# Patient Record
Sex: Female | Born: 1970 | Race: White | Hispanic: No | Smoking: Never smoker
Health system: Southern US, Community
[De-identification: ages and names within clinical notes are randomized; demographics above are authoritative.]

## PROBLEM LIST (undated history)

## (undated) DIAGNOSIS — J45909 Unspecified asthma, uncomplicated: Secondary | ICD-10-CM

## (undated) DIAGNOSIS — E119 Type 2 diabetes mellitus without complications: Secondary | ICD-10-CM

## (undated) DIAGNOSIS — G43909 Migraine, unspecified, not intractable, without status migrainosus: Secondary | ICD-10-CM

## (undated) DIAGNOSIS — Z9189 Other specified personal risk factors, not elsewhere classified: Secondary | ICD-10-CM

## (undated) DIAGNOSIS — E785 Hyperlipidemia, unspecified: Secondary | ICD-10-CM

## (undated) DIAGNOSIS — Z993 Dependence on wheelchair: Secondary | ICD-10-CM

## (undated) DIAGNOSIS — C801 Malignant (primary) neoplasm, unspecified: Secondary | ICD-10-CM

## (undated) HISTORY — DX: Hyperlipidemia, unspecified: E78.5

## (undated) HISTORY — PX: ABDOMINAL HYSTERECTOMY: SHX81

## (undated) HISTORY — DX: Other specified personal risk factors, not elsewhere classified: Z91.89

---

## 2002-06-30 ENCOUNTER — Emergency Department (HOSPITAL_COMMUNITY): Admission: EM | Admit: 2002-06-30 | Discharge: 2002-06-30 | Payer: Self-pay | Admitting: *Deleted

## 2004-05-12 ENCOUNTER — Emergency Department: Payer: Self-pay | Admitting: Emergency Medicine

## 2004-10-01 ENCOUNTER — Emergency Department: Payer: Self-pay | Admitting: Emergency Medicine

## 2004-11-30 ENCOUNTER — Emergency Department: Payer: Self-pay | Admitting: Emergency Medicine

## 2005-04-04 ENCOUNTER — Emergency Department: Payer: Self-pay | Admitting: Internal Medicine

## 2005-07-25 ENCOUNTER — Emergency Department: Payer: Self-pay | Admitting: Emergency Medicine

## 2005-12-23 ENCOUNTER — Emergency Department: Payer: Self-pay | Admitting: Emergency Medicine

## 2006-03-13 ENCOUNTER — Emergency Department: Payer: Self-pay | Admitting: Emergency Medicine

## 2006-04-10 ENCOUNTER — Ambulatory Visit: Payer: Self-pay | Admitting: Internal Medicine

## 2006-07-20 ENCOUNTER — Emergency Department: Payer: Self-pay

## 2006-09-14 ENCOUNTER — Emergency Department: Payer: Self-pay | Admitting: General Practice

## 2006-09-16 ENCOUNTER — Emergency Department: Payer: Self-pay | Admitting: Unknown Physician Specialty

## 2006-12-13 ENCOUNTER — Emergency Department: Payer: Self-pay | Admitting: Emergency Medicine

## 2006-12-13 ENCOUNTER — Other Ambulatory Visit: Payer: Self-pay

## 2007-07-04 ENCOUNTER — Emergency Department: Payer: Self-pay | Admitting: Emergency Medicine

## 2008-04-19 ENCOUNTER — Other Ambulatory Visit: Payer: Self-pay

## 2008-04-19 ENCOUNTER — Emergency Department: Payer: Self-pay | Admitting: Emergency Medicine

## 2009-06-28 ENCOUNTER — Emergency Department: Payer: Self-pay | Admitting: Emergency Medicine

## 2009-08-10 ENCOUNTER — Emergency Department: Payer: Self-pay | Admitting: Emergency Medicine

## 2010-01-09 ENCOUNTER — Emergency Department: Payer: Self-pay | Admitting: Emergency Medicine

## 2010-03-02 ENCOUNTER — Emergency Department: Payer: Self-pay | Admitting: Emergency Medicine

## 2010-03-16 ENCOUNTER — Emergency Department: Payer: Self-pay | Admitting: Emergency Medicine

## 2010-09-28 ENCOUNTER — Emergency Department: Payer: Self-pay | Admitting: Emergency Medicine

## 2010-10-06 ENCOUNTER — Emergency Department: Payer: Self-pay | Admitting: Emergency Medicine

## 2010-11-02 ENCOUNTER — Emergency Department: Payer: Self-pay | Admitting: Internal Medicine

## 2011-01-27 ENCOUNTER — Emergency Department: Payer: Self-pay | Admitting: Internal Medicine

## 2011-04-04 ENCOUNTER — Emergency Department: Payer: Self-pay | Admitting: Emergency Medicine

## 2011-08-14 ENCOUNTER — Emergency Department: Payer: Self-pay | Admitting: Emergency Medicine

## 2011-08-22 ENCOUNTER — Emergency Department: Payer: Self-pay | Admitting: Emergency Medicine

## 2012-01-19 ENCOUNTER — Emergency Department: Payer: Self-pay | Admitting: *Deleted

## 2012-02-02 ENCOUNTER — Emergency Department: Payer: Self-pay | Admitting: Emergency Medicine

## 2012-04-17 ENCOUNTER — Emergency Department: Payer: Self-pay | Admitting: Unknown Physician Specialty

## 2012-04-17 LAB — BASIC METABOLIC PANEL
Anion Gap: 8 (ref 7–16)
Calcium, Total: 8.7 mg/dL (ref 8.5–10.1)
Creatinine: 0.75 mg/dL (ref 0.60–1.30)
EGFR (Non-African Amer.): >60
Glucose: 241 mg/dL — ABNORMAL HIGH (ref 65–99)
Osmolality: 285 (ref 275–301)
Potassium: 4.3 mmol/L (ref 3.5–5.1)
Sodium: 138 mmol/L (ref 136–145)

## 2012-04-17 LAB — HEPATIC FUNCTION PANEL A (ARMC)
Albumin: 3 g/dL — ABNORMAL LOW (ref 3.4–5.0)
Alkaline Phosphatase: 80 U/L (ref 50–136)
Bilirubin,Total: 0.2 mg/dL (ref 0.2–1.0)
SGOT(AST): 17 U/L (ref 15–37)
Total Protein: 7.6 g/dL (ref 6.4–8.2)

## 2012-04-17 LAB — CBC
HGB: 11.8 g/dL — ABNORMAL LOW (ref 12.0–16.0)
MCH: 26 pg (ref 26.0–34.0)
MCV: 78 fL — ABNORMAL LOW (ref 80–100)
RBC: 4.56 10*6/uL (ref 3.80–5.20)
WBC: 7.8 10*3/uL (ref 3.6–11.0)

## 2012-04-17 LAB — TROPONIN I: Troponin-I: <0.02 ng/mL

## 2012-04-17 LAB — CK TOTAL AND CKMB (NOT AT ARMC): CK-MB: 1.3 ng/mL (ref 0.5–3.6)

## 2012-04-17 LAB — LIPASE, BLOOD: Lipase: 128 U/L (ref 73–393)

## 2012-10-07 ENCOUNTER — Emergency Department: Payer: Self-pay | Admitting: Emergency Medicine

## 2012-10-07 LAB — CBC WITH DIFFERENTIAL/PLATELET
Basophil %: 0.8 %
Eosinophil #: 0.4 10*3/uL (ref 0.0–0.7)
HCT: 35.9 % (ref 35.0–47.0)
HGB: 11.8 g/dL — ABNORMAL LOW (ref 12.0–16.0)
Lymphocyte #: 2.2 10*3/uL (ref 1.0–3.6)
MCV: 77 fL — ABNORMAL LOW (ref 80–100)
Monocyte %: 6.6 %
Neutrophil #: 5.2 10*3/uL (ref 1.4–6.5)
Neutrophil %: 61.5 %
Platelet: 333 10*3/uL (ref 150–440)
RBC: 4.64 10*6/uL (ref 3.80–5.20)

## 2012-10-07 LAB — BASIC METABOLIC PANEL
Calcium, Total: 8.4 mg/dL — ABNORMAL LOW (ref 8.5–10.1)
Co2: 27 mmol/L (ref 21–32)
Creatinine: 0.9 mg/dL (ref 0.60–1.30)
Potassium: 4.1 mmol/L (ref 3.5–5.1)
Sodium: 138 mmol/L (ref 136–145)

## 2012-10-13 LAB — CULTURE, BLOOD (SINGLE)

## 2012-11-06 LAB — LIPID PANEL
Cholesterol: 180 mg/dL (ref 0–200)
HDL: 60 mg/dL (ref 35–70)
LDL CALC: 97 mg/dL
TRIGLYCERIDES: 117 mg/dL (ref 40–160)

## 2012-11-13 ENCOUNTER — Other Ambulatory Visit: Payer: Self-pay | Admitting: Ophthalmology

## 2012-11-13 LAB — HM DIABETES EYE EXAM

## 2013-02-23 ENCOUNTER — Emergency Department: Payer: Self-pay | Admitting: Emergency Medicine

## 2013-02-23 LAB — CBC WITH DIFFERENTIAL/PLATELET
Basophil #: 0 10*3/uL (ref 0.0–0.1)
Basophil %: 0.3 %
Lymphocyte #: 1.8 10*3/uL (ref 1.0–3.6)
MCHC: 34.1 g/dL (ref 32.0–36.0)
MCV: 76 fL — ABNORMAL LOW (ref 80–100)
Monocyte #: 0.9 x10 3/mm (ref 0.2–0.9)
Monocyte %: 6.3 %
Neutrophil #: 11.2 10*3/uL — ABNORMAL HIGH (ref 1.4–6.5)
Platelet: 374 10*3/uL (ref 150–440)
RBC: 4.49 10*6/uL (ref 3.80–5.20)
WBC: 14 10*3/uL — ABNORMAL HIGH (ref 3.6–11.0)

## 2013-02-23 LAB — COMPREHENSIVE METABOLIC PANEL
Alkaline Phosphatase: 110 U/L (ref 50–136)
Bilirubin,Total: 0.4 mg/dL (ref 0.2–1.0)
Calcium, Total: 8.8 mg/dL (ref 8.5–10.1)
Chloride: 102 mmol/L (ref 98–107)
Co2: 23 mmol/L (ref 21–32)
Creatinine: 0.87 mg/dL (ref 0.60–1.30)
EGFR (Non-African Amer.): >60
Potassium: 3.3 mmol/L — ABNORMAL LOW (ref 3.5–5.1)
SGOT(AST): 21 U/L (ref 15–37)
Sodium: 133 mmol/L — ABNORMAL LOW (ref 136–145)
Total Protein: 8.2 g/dL (ref 6.4–8.2)

## 2013-06-10 ENCOUNTER — Emergency Department: Payer: Self-pay | Admitting: Emergency Medicine

## 2013-07-20 ENCOUNTER — Emergency Department: Payer: Self-pay | Admitting: Emergency Medicine

## 2013-07-20 LAB — CBC
HCT: 37.7 % (ref 35.0–47.0)
HGB: 12.4 g/dL (ref 12.0–16.0)
MCH: 25.6 pg — ABNORMAL LOW (ref 26.0–34.0)
MCHC: 33 g/dL (ref 32.0–36.0)
Platelet: 322 10*3/uL (ref 150–440)
RBC: 4.86 10*6/uL (ref 3.80–5.20)
WBC: 9.6 10*3/uL (ref 3.6–11.0)

## 2013-07-20 LAB — URINALYSIS, COMPLETE
Bilirubin,UR: NEGATIVE
Leukocyte Esterase: NEGATIVE
Nitrite: NEGATIVE
Protein: 30
RBC,UR: 3 /HPF (ref 0–5)
Squamous Epithelial: 4
WBC UR: 2 /HPF (ref 0–5)

## 2013-07-20 LAB — TROPONIN I
Troponin-I: <0.02 ng/mL
Troponin-I: <0.02 ng/mL

## 2013-07-20 LAB — ETHANOL
Ethanol %: <0.003 % (ref 0.000–0.080)
Ethanol: <3 mg/dL

## 2013-07-20 LAB — COMPREHENSIVE METABOLIC PANEL
Albumin: 3.1 g/dL — ABNORMAL LOW (ref 3.4–5.0)
Alkaline Phosphatase: 87 U/L
BUN: 12 mg/dL (ref 7–18)
Chloride: 104 mmol/L (ref 98–107)
Creatinine: 0.73 mg/dL (ref 0.60–1.30)
EGFR (Non-African Amer.): >60
Glucose: 293 mg/dL — ABNORMAL HIGH (ref 65–99)
Potassium: 4.2 mmol/L (ref 3.5–5.1)
SGOT(AST): 24 U/L (ref 15–37)
Sodium: 132 mmol/L — ABNORMAL LOW (ref 136–145)

## 2013-07-20 LAB — DRUG SCREEN, URINE
Amphetamines, Ur Screen: NEGATIVE (ref ?–1000)
Benzodiazepine, Ur Scrn: NEGATIVE (ref ?–200)
Cannabinoid 50 Ng, Ur ~~LOC~~: NEGATIVE (ref ?–50)
Cocaine Metabolite,Ur ~~LOC~~: NEGATIVE (ref ?–300)
Methadone, Ur Screen: NEGATIVE (ref ?–300)
Opiate, Ur Screen: NEGATIVE (ref ?–300)
Phencyclidine (PCP) Ur S: NEGATIVE (ref ?–25)

## 2013-07-23 DIAGNOSIS — Z9189 Other specified personal risk factors, not elsewhere classified: Secondary | ICD-10-CM

## 2013-07-23 HISTORY — DX: Other specified personal risk factors, not elsewhere classified: Z91.89

## 2014-02-04 ENCOUNTER — Emergency Department: Payer: Self-pay | Admitting: Emergency Medicine

## 2014-02-06 ENCOUNTER — Inpatient Hospital Stay: Payer: Self-pay | Admitting: Internal Medicine

## 2014-02-06 LAB — URINALYSIS, COMPLETE
BACTERIA: NONE SEEN
BLOOD: NEGATIVE
Bilirubin,UR: NEGATIVE
Glucose,UR: >=500 mg/dL (ref 0–75)
KETONE: NEGATIVE
Leukocyte Esterase: NEGATIVE
NITRITE: NEGATIVE
Ph: 5 (ref 4.5–8.0)
RBC,UR: 7 /HPF (ref 0–5)
Specific Gravity: 1.01 (ref 1.003–1.030)
Squamous Epithelial: 2
WBC UR: 6 /HPF (ref 0–5)

## 2014-02-06 LAB — CBC WITH DIFFERENTIAL/PLATELET
BASOS ABS: 0.1 10*3/uL (ref 0.0–0.1)
BASOS PCT: 0.4 %
EOS ABS: 0 10*3/uL (ref 0.0–0.7)
EOS PCT: 0.4 %
HCT: 35.6 % (ref 35.0–47.0)
HGB: 11.6 g/dL — AB (ref 12.0–16.0)
LYMPHS ABS: 1 10*3/uL (ref 1.0–3.6)
Lymphocyte %: 7.9 %
MCH: 25.8 pg — ABNORMAL LOW (ref 26.0–34.0)
MCHC: 32.7 g/dL (ref 32.0–36.0)
MCV: 79 fL — AB (ref 80–100)
MONOS PCT: 7.6 %
Monocyte #: 1 10*3/uL — ABNORMAL HIGH (ref 0.2–0.9)
NEUTROS ABS: 11 10*3/uL — AB (ref 1.4–6.5)
NEUTROS PCT: 83.7 %
PLATELETS: 330 10*3/uL (ref 150–440)
RBC: 4.51 10*6/uL (ref 3.80–5.20)
RDW: 14.3 % (ref 11.5–14.5)
WBC: 13.2 10*3/uL — ABNORMAL HIGH (ref 3.6–11.0)

## 2014-02-06 LAB — COMPREHENSIVE METABOLIC PANEL
Albumin: 2.6 g/dL — ABNORMAL LOW (ref 3.4–5.0)
Alkaline Phosphatase: 97 U/L
Anion Gap: 9 (ref 7–16)
BILIRUBIN TOTAL: 0.4 mg/dL (ref 0.2–1.0)
BUN: 8 mg/dL (ref 7–18)
CO2: 24 mmol/L (ref 21–32)
Calcium, Total: 8.1 mg/dL — ABNORMAL LOW (ref 8.5–10.1)
Chloride: 99 mmol/L (ref 98–107)
Creatinine: 0.95 mg/dL (ref 0.60–1.30)
EGFR (African American): >60
EGFR (Non-African Amer.): >60
Glucose: 353 mg/dL — ABNORMAL HIGH (ref 65–99)
Osmolality: 277 (ref 275–301)
Potassium: 3.8 mmol/L (ref 3.5–5.1)
SGOT(AST): 12 U/L — ABNORMAL LOW (ref 15–37)
SGPT (ALT): 12 U/L (ref 12–78)
Sodium: 132 mmol/L — ABNORMAL LOW (ref 136–145)
TOTAL PROTEIN: 7.6 g/dL (ref 6.4–8.2)

## 2014-02-07 LAB — CBC WITH DIFFERENTIAL/PLATELET
BASOS ABS: 0.1 10*3/uL (ref 0.0–0.1)
Basophil %: 0.4 %
EOS ABS: 0.1 10*3/uL (ref 0.0–0.7)
EOS PCT: 0.5 %
HCT: 34.1 % — AB (ref 35.0–47.0)
HGB: 10.8 g/dL — ABNORMAL LOW (ref 12.0–16.0)
LYMPHS ABS: 1.3 10*3/uL (ref 1.0–3.6)
Lymphocyte %: 10.7 %
MCH: 25.1 pg — ABNORMAL LOW (ref 26.0–34.0)
MCHC: 31.7 g/dL — ABNORMAL LOW (ref 32.0–36.0)
MCV: 79 fL — AB (ref 80–100)
Monocyte #: 1.1 x10 3/mm — ABNORMAL HIGH (ref 0.2–0.9)
Monocyte %: 8.7 %
NEUTROS PCT: 79.7 %
Neutrophil #: 10 10*3/uL — ABNORMAL HIGH (ref 1.4–6.5)
Platelet: 309 10*3/uL (ref 150–440)
RBC: 4.3 10*6/uL (ref 3.80–5.20)
RDW: 14.1 % (ref 11.5–14.5)
WBC: 12.6 10*3/uL — ABNORMAL HIGH (ref 3.6–11.0)

## 2014-02-07 LAB — BASIC METABOLIC PANEL
Anion Gap: 6 — ABNORMAL LOW (ref 7–16)
BUN: 6 mg/dL — ABNORMAL LOW (ref 7–18)
CALCIUM: 7.8 mg/dL — AB (ref 8.5–10.1)
Chloride: 100 mmol/L (ref 98–107)
Co2: 26 mmol/L (ref 21–32)
Creatinine: 0.88 mg/dL (ref 0.60–1.30)
GLUCOSE: 265 mg/dL — AB (ref 65–99)
Osmolality: 271 (ref 275–301)
Potassium: 3.9 mmol/L (ref 3.5–5.1)
SODIUM: 132 mmol/L — AB (ref 136–145)

## 2014-02-07 LAB — MAGNESIUM: MAGNESIUM: 1.8 mg/dL

## 2014-02-08 LAB — BASIC METABOLIC PANEL
Anion Gap: 6 — ABNORMAL LOW (ref 7–16)
BUN: 5 mg/dL — ABNORMAL LOW (ref 7–18)
CALCIUM: 7.8 mg/dL — AB (ref 8.5–10.1)
CHLORIDE: 100 mmol/L (ref 98–107)
CREATININE: 0.78 mg/dL (ref 0.60–1.30)
Co2: 27 mmol/L (ref 21–32)
GLUCOSE: 220 mg/dL — AB (ref 65–99)
Osmolality: 270 (ref 275–301)
POTASSIUM: 3.7 mmol/L (ref 3.5–5.1)
Sodium: 133 mmol/L — ABNORMAL LOW (ref 136–145)

## 2014-02-08 LAB — CBC WITH DIFFERENTIAL/PLATELET
BASOS ABS: 0 10*3/uL (ref 0.0–0.1)
BASOS PCT: 0.4 %
EOS ABS: 0.1 10*3/uL (ref 0.0–0.7)
Eosinophil %: 0.6 %
HCT: 32.4 % — ABNORMAL LOW (ref 35.0–47.0)
HGB: 10.4 g/dL — ABNORMAL LOW (ref 12.0–16.0)
LYMPHS ABS: 1.6 10*3/uL (ref 1.0–3.6)
Lymphocyte %: 14.3 %
MCH: 25.3 pg — ABNORMAL LOW (ref 26.0–34.0)
MCHC: 32.2 g/dL (ref 32.0–36.0)
MCV: 78 fL — ABNORMAL LOW (ref 80–100)
MONO ABS: 0.8 x10 3/mm (ref 0.2–0.9)
Monocyte %: 7.4 %
NEUTROS PCT: 77.3 %
Neutrophil #: 8.5 10*3/uL — ABNORMAL HIGH (ref 1.4–6.5)
PLATELETS: 331 10*3/uL (ref 150–440)
RBC: 4.13 10*6/uL (ref 3.80–5.20)
RDW: 14.1 % (ref 11.5–14.5)
WBC: 11 10*3/uL (ref 3.6–11.0)

## 2014-02-08 LAB — HEMOGLOBIN A1C: HEMOGLOBIN A1C: 11.3 % — AB (ref 4.2–6.3)

## 2014-02-08 LAB — VANCOMYCIN, TROUGH: Vancomycin, Trough: 6 ug/mL — ABNORMAL LOW (ref 10–20)

## 2014-02-09 LAB — CBC WITH DIFFERENTIAL/PLATELET
BASOS PCT: 0.3 %
Basophil #: 0 10*3/uL (ref 0.0–0.1)
EOS ABS: 0.2 10*3/uL (ref 0.0–0.7)
Eosinophil %: 2.4 %
HCT: 31.8 % — ABNORMAL LOW (ref 35.0–47.0)
HGB: 10.6 g/dL — ABNORMAL LOW (ref 12.0–16.0)
LYMPHS ABS: 1.5 10*3/uL (ref 1.0–3.6)
Lymphocyte %: 19.3 %
MCH: 26.1 pg (ref 26.0–34.0)
MCHC: 33.5 g/dL (ref 32.0–36.0)
MCV: 78 fL — AB (ref 80–100)
MONOS PCT: 6.8 %
Monocyte #: 0.5 x10 3/mm (ref 0.2–0.9)
NEUTROS PCT: 71.2 %
Neutrophil #: 5.6 10*3/uL (ref 1.4–6.5)
PLATELETS: 341 10*3/uL (ref 150–440)
RBC: 4.07 10*6/uL (ref 3.80–5.20)
RDW: 14.5 % (ref 11.5–14.5)
WBC: 7.9 10*3/uL (ref 3.6–11.0)

## 2014-02-09 LAB — BASIC METABOLIC PANEL
Anion Gap: 5 — ABNORMAL LOW (ref 7–16)
BUN: 6 mg/dL — AB (ref 7–18)
CHLORIDE: 102 mmol/L (ref 98–107)
CO2: 29 mmol/L (ref 21–32)
CREATININE: 0.8 mg/dL (ref 0.60–1.30)
Calcium, Total: 8.1 mg/dL — ABNORMAL LOW (ref 8.5–10.1)
EGFR (African American): >60
Glucose: 247 mg/dL — ABNORMAL HIGH (ref 65–99)
OSMOLALITY: 278 (ref 275–301)
POTASSIUM: 3.5 mmol/L (ref 3.5–5.1)
Sodium: 136 mmol/L (ref 136–145)

## 2014-02-09 LAB — HCG, QUANTITATIVE, PREGNANCY

## 2014-02-10 LAB — BASIC METABOLIC PANEL
Anion Gap: 8 (ref 7–16)
BUN: 5 mg/dL — ABNORMAL LOW (ref 7–18)
CALCIUM: 8.3 mg/dL — AB (ref 8.5–10.1)
CO2: 27 mmol/L (ref 21–32)
Chloride: 103 mmol/L (ref 98–107)
Creatinine: 0.8 mg/dL (ref 0.60–1.30)
EGFR (African American): >60
Glucose: 220 mg/dL — ABNORMAL HIGH (ref 65–99)
Osmolality: 280 (ref 275–301)
Potassium: 3.8 mmol/L (ref 3.5–5.1)
SODIUM: 138 mmol/L (ref 136–145)

## 2014-02-10 LAB — CBC WITH DIFFERENTIAL/PLATELET
BASOS ABS: 0 10*3/uL (ref 0.0–0.1)
Basophil %: 0.5 %
EOS ABS: 0.3 10*3/uL (ref 0.0–0.7)
EOS PCT: 4 %
HCT: 33.3 % — ABNORMAL LOW (ref 35.0–47.0)
HGB: 11 g/dL — AB (ref 12.0–16.0)
Lymphocyte #: 1.4 10*3/uL (ref 1.0–3.6)
Lymphocyte %: 17.8 %
MCH: 25.6 pg — ABNORMAL LOW (ref 26.0–34.0)
MCHC: 33.1 g/dL (ref 32.0–36.0)
MCV: 78 fL — AB (ref 80–100)
MONO ABS: 0.6 x10 3/mm (ref 0.2–0.9)
MONOS PCT: 7.4 %
Neutrophil #: 5.6 10*3/uL (ref 1.4–6.5)
Neutrophil %: 70.3 %
PLATELETS: 414 10*3/uL (ref 150–440)
RBC: 4.29 10*6/uL (ref 3.80–5.20)
RDW: 14.1 % (ref 11.5–14.5)
WBC: 8 10*3/uL (ref 3.6–11.0)

## 2014-02-10 LAB — RAPID HIV SCREEN (HIV 1/2 AB+AG)

## 2014-02-10 LAB — VANCOMYCIN, TROUGH: Vancomycin, Trough: 8 ug/mL — ABNORMAL LOW (ref 10–20)

## 2014-02-11 LAB — WOUND AEROBIC CULTURE

## 2014-02-11 LAB — CULTURE, BLOOD (SINGLE)

## 2014-02-13 LAB — WOUND CULTURE

## 2014-05-20 ENCOUNTER — Emergency Department: Payer: Self-pay | Admitting: Emergency Medicine

## 2014-05-24 ENCOUNTER — Emergency Department: Payer: Self-pay | Admitting: Emergency Medicine

## 2014-05-24 LAB — CBC
HCT: 35.2 % (ref 35.0–47.0)
HGB: 11.6 g/dL — AB (ref 12.0–16.0)
MCH: 26.2 pg (ref 26.0–34.0)
MCHC: 33 g/dL (ref 32.0–36.0)
MCV: 79 fL — ABNORMAL LOW (ref 80–100)
Platelet: 383 10*3/uL (ref 150–440)
RBC: 4.44 10*6/uL (ref 3.80–5.20)
RDW: 14.4 % (ref 11.5–14.5)
WBC: 7.5 10*3/uL (ref 3.6–11.0)

## 2014-05-24 LAB — COMPREHENSIVE METABOLIC PANEL
ALBUMIN: 2.9 g/dL — AB (ref 3.4–5.0)
AST: 14 U/L — AB (ref 15–37)
Alkaline Phosphatase: 83 U/L
Anion Gap: 7 (ref 7–16)
BUN: 9 mg/dL (ref 7–18)
Bilirubin,Total: 0.2 mg/dL (ref 0.2–1.0)
CO2: 25 mmol/L (ref 21–32)
CREATININE: 1.07 mg/dL (ref 0.60–1.30)
Calcium, Total: 8.1 mg/dL — ABNORMAL LOW (ref 8.5–10.1)
Chloride: 107 mmol/L (ref 98–107)
EGFR (African American): >60
GFR CALC NON AF AMER: 59 — AB
GLUCOSE: 231 mg/dL — AB (ref 65–99)
OSMOLALITY: 284 (ref 275–301)
Potassium: 4 mmol/L (ref 3.5–5.1)
SGPT (ALT): 15 U/L
SODIUM: 139 mmol/L (ref 136–145)
Total Protein: 7.8 g/dL (ref 6.4–8.2)

## 2014-05-24 LAB — CK TOTAL AND CKMB (NOT AT ARMC)
CK, TOTAL: 86 U/L
CK-MB: 1.3 ng/mL (ref 0.5–3.6)

## 2014-05-24 LAB — TROPONIN I

## 2014-07-28 ENCOUNTER — Encounter (INDEPENDENT_AMBULATORY_CARE_PROVIDER_SITE_OTHER): Payer: Self-pay

## 2014-07-28 ENCOUNTER — Ambulatory Visit (INDEPENDENT_AMBULATORY_CARE_PROVIDER_SITE_OTHER): Payer: Self-pay | Admitting: Cardiovascular Disease

## 2014-07-28 ENCOUNTER — Emergency Department: Payer: Self-pay | Admitting: Emergency Medicine

## 2014-07-28 VITALS — BP 145/84 | HR 100 | Ht 65.0 in

## 2014-07-28 DIAGNOSIS — R0789 Other chest pain: Secondary | ICD-10-CM

## 2014-07-28 DIAGNOSIS — R079 Chest pain, unspecified: Secondary | ICD-10-CM

## 2014-07-28 LAB — BASIC METABOLIC PANEL
ANION GAP: 7 (ref 7–16)
BUN: 14 mg/dL (ref 7–18)
CALCIUM: 8.4 mg/dL — AB (ref 8.5–10.1)
CHLORIDE: 105 mmol/L (ref 98–107)
Co2: 25 mmol/L (ref 21–32)
Creatinine: 1.05 mg/dL (ref 0.60–1.30)
EGFR (African American): >60
EGFR (Non-African Amer.): >60
GLUCOSE: 286 mg/dL — AB (ref 65–99)
OSMOLALITY: 285 (ref 275–301)
Potassium: 4.1 mmol/L (ref 3.5–5.1)
Sodium: 137 mmol/L (ref 136–145)

## 2014-07-28 LAB — CBC
HCT: 36.6 % (ref 35.0–47.0)
HGB: 12 g/dL (ref 12.0–16.0)
MCH: 26 pg (ref 26.0–34.0)
MCHC: 32.9 g/dL (ref 32.0–36.0)
MCV: 79 fL — AB (ref 80–100)
Platelet: 301 10*3/uL (ref 150–440)
RBC: 4.63 10*6/uL (ref 3.80–5.20)
RDW: 14.7 % — AB (ref 11.5–14.5)
WBC: 7.8 10*3/uL (ref 3.6–11.0)

## 2014-07-28 LAB — TROPONIN I
Troponin-I: <0.02 ng/mL
Troponin-I: <0.02 ng/mL

## 2014-07-28 NOTE — Procedures (Signed)
Exercise Treadmill Test  Treadmill ordered for recent epsiodes of chest pain.  Resting EKG shows NSR with rate of 80 bpm, nonspecific St changes, rare APCs. Resting blood pressure of 122/62. Stand bruce protocal was used.  Patient exercised for 6 min 49 sec,  Peak heart rate of 150 bpm.  This was 84% of the maximum predicted heart rate (177 bpm). Achieved 8.2 METS No symptoms of chest pain or lightheadedness were reported at peak stress or in recovery.  Peak Blood pressure recorded was 159/89 Heart rate at 3 minutes in recovery was 120 bpm. No significant ST or T-wave changes at peak stress or in recovery  FINAL IMPRESSION: Normal exercise stress test. No significant EKG changes concerning for ischemia. Good exercise tolerance.

## 2014-07-28 NOTE — Patient Instructions (Signed)
Your stress test was normal.   Call or return to clinic prn if these symptoms worsen or fail to improve as anticipated.

## 2014-09-20 ENCOUNTER — Emergency Department: Payer: Self-pay | Admitting: Emergency Medicine

## 2014-11-13 NOTE — Consult Note (Signed)
PATIENT NAME:  Wendy Frazier, Wendy Frazier MR#:  [id number] DATE OF BIRTH:  Oct 16, 1970  DATE OF CONSULTATION:  02/08/2014  CONSULTING PHYSICIAN:  Cheral Marker. Ola Spurr, MD  REQUESTING PHYSICIAN: Dr. Manuella Ghazi.   REASON FOR CONSULTATION: Sepsis and cellulitis.   HISTORY OF PRESENT ILLNESS: This is a 44 year old female with a history of very poorly controlled diabetes on no medication who was admitted 07/18 after 5 days of worsening back pain from a lumbar abscess. Two days prior to admission she had apparently had drainage with I and D of an abscess but no cultures were sent. When she came back for a wound check, she was found to have fevers and chills. She was admitted for further treatment. Since being admitted. She has been treated with vancomycin and Zosyn. She has had some recurrent fevers as well but reports she is improving somewhat. She has been seen by surgery, who did not feel there was any need for further abscess debridement. She has had an MRI and CT scan, both of which were negative for drainable focal fluid collection.   The patient reports she has had prior abscesses and boils before. She is not sure if any of them were ever MRSA. She has not needed admission for any of these before. Her husband, who she lives with, also has had recurrent boils.   PAST MEDICAL HISTORY:  1. Diabetes, poorly controlled on no medications.  2. Hyperlipidemia.   PAST SURGICAL HISTORY: None.   SOCIAL HISTORY: Denies tobacco, alcohol, or drugs. She lives with her husband.   FAMILY HISTORY: Positive for diabetes.   ALLERGIES: SHE IS ALLERGIC TO MORPHINE.   ANTIBIOTICS SINCE ADMISSION: Include vancomycin and Zosyn.   REVIEW OF SYSTEMS: Eleven systems were reviewed and negative except as per HPI.   PHYSICAL EXAMINATION:  VITAL SIGNS: Temperature on admission was 102.8. On the 19th she had a temperature of 102.4. Currently she is 98.7, blood pressure is 116/74, respirations 83, sat 96% on room air.  GENERAL: She is  obese. She is lying in bed in some pain.  HEENT: Pupils equal, round, and reactive to light and accommodation. Extraocular movements are intact. Sclerae are anicteric.  OROPHARYNX: Clear.  NECK: Supple.  HEART: Regular.  LUNGS: Clear.  ABDOMEN: Soft, nontender, nondistended.  EXTREMITIES: No clubbing, cyanosis, or edema.  SKIN: She has on her back a large area of induration approximately softball size. In the center approximately a golf ball size, there is a round area of indurationand  wound with heaped up  edged. There is purulence at this site. It is quite tender.  NEUROLOGIC: She is alert and oriented x 3. Grossly nonfocal neuro exam.   LABORATORY DATA: White blood count on admission was 13.2, currently it is 11.0. Hemoglobin 10.4, platelets 331,000. Blood cultures x 2 are negative. Urinalysis on admission was negative. Renal function shows a creatinine 0.78. A1c is 11.3. LFTs are normal, except low albumin at 2.6.   Imaging: CT of her lumbar spine with contrast showed severe edema in the subcutaneous fat of the back extending from T11 to S1 with overlying skin thickening concerning the cellulitis. There is no drainable fluid collection. MRI of the lumbar spine showed cellulitis of the posterior fat. No evidence of focal drainable collection. No evidence of spinal involvement.   IMPRESSION:  1. A 44 year old with very poorly controlled diabetes admitted with cellulitis and abscess of her back over her lumbar spine. She had drainage apparently in the ER, but no cultures were sent. Blood cultures  are negative. Her white blood count was 13 on admission and is now down to 11. Her fever curve is decreasing. She has been responding to vancomycin and Zosyn.  2. Cultures were sent today. I would await these culture and sensitivity results before deciding on final antibiotics. This is most likely methicillin-resistant Staphylococcus aureus but it could also be superinfected.  3. I have asked the nurse  to do wet-to-dry dressing changes twice a day to see if we can remove some of the purulent exudate.  4. There does not appear to be anything focal to drain at this point.  5. If she improves over the next 1-2 days and cultures become available she could be discharged on oral antibiotics as there is no bony involvement.   Thank you for the consult. I will be glad to follow with you.     ____________________________ Cheral Marker. Ola Spurr, MD dpf:lt D: 02/08/2014 21:42:09 ET T: 02/09/2014 00:46:10 ET JOB#: [id number]  cc: Cheral Marker. Ola Spurr, MD, <Dictator> Lundynn Cohoon Ola Spurr MD ELECTRONICALLY SIGNED 02/10/2014 23:54

## 2014-11-13 NOTE — Op Note (Signed)
PATIENT NAME:  Wendy Frazier, ENNEKING MR#:  [id number] DATE OF BIRTH:  Dec 14, 1970  DATE OF PROCEDURE:  02/09/2014  ATTENDING PHYSICIAN: Chauncey Reading, MD.  PREOPERATIVE DIAGNOSIS: Back abscess.   POSTOPERATIVE DIAGNOSIS: Back abscess.   PROCEDURE PERFORMED: Incision and drainage of back abscess and insertion of half-inch  Penrose drain.   ANESTHESIA: General. LMA.   COMPLICATIONS: None.   INDICATION FOR SURGERY: [name] is a pleasant 44 year old female who presented with a large back abscess who was in need of operative debridement. She was brought to the operating room for this.   DETAILS OF PROCEDURE: Informed consent was obtained.  [name] was brought to operating room. She was induced and endotracheal tube was placed.  General anesthesia was administered. Her back was prepped and draped in a standard surgical fashion. She was placed in the right lateral decubitus position. The incision was made over the area of induration where there was some drainage. This was deepened down into a large cavity which was opened up. There was induration and the cavity had tracked to her left side lateraly   Hemostat was placed in the cavity and all loculations were broken up.  The cavity was irrigated and a  counterincision was made at the lateral most aspect of the cavity.  An inch drain was placed through this cavity and it was sutured in place in 2 places with a 3-0 nylon suture. The sterile dressing was then placed over the wound and the patient was then awoken, extubated and brought to the postanesthesia care unit. There were no immediate complications. Needle, sponge, and instrument counts were correct at the end of the procedure.    ____________________________ Glena Norfolk. Skylar Priest, MD cal:ds D: 02/11/2014 19:22:00 ET T: 02/11/2014 19:53:23 ET JOB#: [id number]  cc: Harrell Gave A. Argentina Kosch, MD, <Dictator> Floyde Parkins MD ELECTRONICALLY SIGNED 02/18/2014 14:53

## 2014-11-13 NOTE — Discharge Summary (Signed)
PATIENT NAME:  Wendy Frazier, Wendy Frazier MR#:  [id number] DATE OF BIRTH:  [birth year]  DATE OF ADMISSION:  02/06/2014 DATE OF DISCHARGE:  02/11/2014  PRIMARY CARE PHYSICIAN: Nonlocal.   CONSULTATIONS: Dr. Ola Spurr, Dr. Pat Patrick.  PROCEDURE:  Incision and debridement.   DISCHARGE DIAGNOSES: 1.  Sepsis due to lumbar area cellulitis.  2.  Diabetes.  3.  Hyponatremia.   CONDITION: Stable.   CODE STATUS: Full code.   HOME MEDICATIONS:  1.  Metformin 1000 mg p.o. b.i.d. with meals. 2.  Actos 30 mg p.o. once a day. 3.  Keflex 500 mg t.i.d. for 10 days.  4.  Oxycodone 5 mg p.o. every 8 hours p.r.n.   DIET: Low-fat, low-cholesterol, ADA diet.   ACTIVITY: As tolerated.   FOLLOWUP CARE:  Follow up PCP Dr. Ola Spurr and Dr. Rexene Edison  within 1-2 weeks.   REASON FOR ADMISSION: Back pain and a fever.   HOSPITAL COURSE: The patient is a 44 year old Caucasian female with a history of diabetes on no medications, presented with back pain and fever about 5 days. The patient was noticed to have lumbar abscess underwent I and D.  For detailed history and physical examination, please refer to his admission note, dictated by Dr. Lavetta Nielsen.   LABORATORY DATA:  On admission date, the patient's sodium 132, potassium 3.8, chloride 99, bicarbonate 24, BUN 8, creatinine 0.95, glucose 353. WBC 17.2, hemoglobin 11.6. MRI of lumbar spine showed consistent with cellulitis.   ASSESSMENT: 1.  Sepsis with lumbar area of cellulitis. The patient was treated with Zosyn and vancomycin.  Dr. Ola Spurr evaluated the patient and suggest change to Ancef IV q. 8 hours according to would culture sensitivity. Dr. Ola Spurr suggested to continue Keflex 500 mg t.i.d. for 10 days after discharge.  2.  Diabetes 2.  Blood sugar is not very well controlled. The patient has been treated with metformin and Actos. We will continue with medication after discharge.  3.  Hyponatremia stable. Appears to be chronic.  4.  The patient has no complaints.  Vital signs are stable. She is clinically stable and will be discharged to home today. I discussed the patient's discharge plan with the patients nurse, case manager, Dr. Ola Spurr.   TIME SPENT: About 37 minutes.   ____________________________ Demetrios Loll, MD qc:ds D: 02/11/2014 14:06:31 ET T: 02/11/2014 15:18:04 ET JOB#: [id number]  cc: Demetrios Loll, MD, <Dictator> Demetrios Loll MD ELECTRONICALLY SIGNED 02/12/2014 17:24

## 2014-11-13 NOTE — H&P (Signed)
PATIENT NAME:  Wendy Frazier, Wendy Frazier MR#:  [id number] DATE OF BIRTH:  1970-10-25  DATE OF ADMISSION:  02/06/2014  REFERRING PHYSICIAN: Delene Loll, PA  PRIMARY CARE PHYSICIAN: Nonlocal.   CHIEF COMPLAINT: Back pain, fever.  HISTORY OF PRESENT ILLNESS: A 43 year old Caucasian female with history of diabetes on no medications presenting with back pain and fever. She has had back pain which originally started about 5 days ago, gradually worsened. Presented to the Emergency Department 2 days ago and noted to have a lumbar abscess, underwent incision and drainage. Since that time has been complaining of fever, documented temperature at home 103 degrees Fahrenheit with associated chills, generalized fatigue and weakness. As far as her back pain, still present 6/10 in intensity described only as "pain." Worse when lying down and movement. No relieving factors. No radiation. She had denoted some drainage on her bandage but describes it as nonpurulent.   REVIEW OF SYSTEMS:  CONSTITUTIONAL: Positive for fever, chills, fatigue, weakness as described above.  EYES: Denies blurry, double vision, or eye pain.  HEENT: Denies tinnitus, ear pain, hearing loss.  RESPIRATORY: Denies cough, wheeze, shortness of breath. CARDIOVASCULAR: Denies chest pain, palpitations, edema.  GASTROINTESTINAL: Denies nausea, vomiting, diarrhea, abdominal pain.  GENITOURINARY: Denies dysuria or hematuria.  ENDOCRINE: Denies nocturia or thyroid problems.  HEMATOLOGIC AND LYMPHATIC: Denies easy bruising, bleeding. SKIN: Denies rashes or lesions to her knowledge.  MUSCULOSKELETAL: Positive for back pain as described above. Denies any neck, shoulder, knee, hip pain, any further arthritic symptoms.  NEUROLOGIC: Denies any paralysis or paresthesias.  PSYCHIATRIC: Denies anxiety or depressive symptoms.   Otherwise, full review of systems performed by me is negative.   PAST MEDICAL HISTORY: Diabetes, hyperlipidemia.   SOCIAL HISTORY: Denies  any alcohol, tobacco, or drug usage.   FAMILY HISTORY: Positive for diabetes.   ALLERGIES: MORPHINE, WHICH " MAKES HER INSIDES BLEED."   HOME MEDICATIONS: None.   PHYSICAL EXAMINATION:  VITAL SIGNS: Temperature 103.2, heart rate 120, respirations 20. Blood pressure 134/79, saturating 96% on room air. Weight 86.1 kg, BMI 32.6.  GENERAL: Ill-appearing Caucasian female, currently in no acute distress.  HEAD: Normocephalic, atraumatic.  EYES: Pupils equal, round, reactive to light. Extraocular muscles intact. No scleral icterus.  MOUTH: Moist mucous membranes. Dentition intact. No abscess noted.  EARS, NOSE, AND THROAT: Clear without exudates. No external lesions.  NECK: Supple. No thyromegaly. No nodules. No JVD.  PULMONARY: Clear to auscultation bilaterally without wheezes, rales, or rhonchi. No use of accessory muscles. Good respiratory effort.  CHEST:  Nontender to palpation.  CARDIOVASCULAR: S1, S2, tachycardic. No murmurs, rubs, or gallops. No edema. Pedal pulses 2+ bilaterally.  GASTROINTESTINAL: Soft, nontender, nondistended. No masses. Positive bowel sounds. No hepatosplenomegaly.  MUSCULOSKELETAL: No swelling, clubbing, or edema. Range of motion is full in all extremities. Back: There is tenderness to palpation around L1, L2, which is where the site of her I and D is located. At that point, there is approximately a 1 x 1-cm ulceration with minimal drainage; however, surrounded with erythema which is warm to touch.  SKIN: No further ulcerations, lesions, or rashes, or cyanosis other than described on back. NEUROLOGIC: Cranial nerves II through XII intact. No gross focal neurologic deficits. Sensation to reflexes intact. PSYCHIATRIC: Mood and affect within normal limits. The patient alert and oriented x 3. Insight and judgment intact.   LABORATORY DATA: Sodium 132, potassium 3.8, chloride 99, bicarbonate 24, BUN 8, creatinine 0.95, glucose 353. LFTs: Albumin 2.6, otherwise within normal  limits. WBC of 13.2,  hemoglobin of 11.6, platelets of 330.000. She had an MRI of her lumbar spine revealing erythematous change of the posterior fat diffuse; however, no evidence of drainable focal collection. This is consistent with cellulitis. No spinal involvement.   ASSESSMENT AND PLAN: A 44 year old female with history of diabetes, presenting with back pain and fever.  1. Sepsis: Meeting septic criteria by temperature, heart rate, leukocytosis present on admission secondary to cellulitis. Cultures have already been obtained. Follow culture data. Antibiotic coverage. He received Zosyn and vancomycin in the Emergency Department, continue to do vancomycin. Follow culture data to taper antibiotics when able. Provide pain medications. Initiate bowel regimen while she is on pain medications. Initiate IV fluid hydration with normal saline to keep mean artery pressure greater than 65.  2. Diabetes type 2 on no home medications. However, markedly hyperglycemic in the ER. We will initiate insulin sliding scale with q. 6-hour Accu-Cheks.  3. Hyponatremia: Intravenous fluid hydration. Follow sodium levels.  4. Venous thromboembolism prophylaxis work with heparin subcutaneous.   CODE STATUS: The patient is full code.   TIME SPENT: 45 minutes.    ____________________________ Aaron Mose. Jamon Hayhurst, MD dkh:lt D: 02/06/2014 22:13:09 ET T: 02/06/2014 22:56:04 ET JOB#: [id number]  cc: Aaron Mose. Dodger Sinning, MD, <Dictator> Sherrika Weakland Woodfin Ganja MD ELECTRONICALLY SIGNED 02/07/2014 20:28

## 2015-01-03 ENCOUNTER — Emergency Department
Admission: EM | Admit: 2015-01-03 | Discharge: 2015-01-03 | Disposition: A | Discharge status: Home / Self Care | Payer: Self-pay | Attending: Emergency Medicine | Admitting: Emergency Medicine

## 2015-01-03 ENCOUNTER — Encounter: Payer: Self-pay | Admitting: Emergency Medicine

## 2015-01-03 ENCOUNTER — Emergency Department: Payer: Self-pay

## 2015-01-03 DIAGNOSIS — W108XXA Fall (on) (from) other stairs and steps, initial encounter: Secondary | ICD-10-CM | POA: Insufficient documentation

## 2015-01-03 DIAGNOSIS — T148 Other injury of unspecified body region: Secondary | ICD-10-CM | POA: Insufficient documentation

## 2015-01-03 DIAGNOSIS — Y9389 Activity, other specified: Secondary | ICD-10-CM | POA: Insufficient documentation

## 2015-01-03 DIAGNOSIS — W1809XA Striking against other object with subsequent fall, initial encounter: Secondary | ICD-10-CM

## 2015-01-03 DIAGNOSIS — Y92009 Unspecified place in unspecified non-institutional (private) residence as the place of occurrence of the external cause: Secondary | ICD-10-CM | POA: Insufficient documentation

## 2015-01-03 DIAGNOSIS — Y998 Other external cause status: Secondary | ICD-10-CM | POA: Insufficient documentation

## 2015-01-03 DIAGNOSIS — E119 Type 2 diabetes mellitus without complications: Secondary | ICD-10-CM | POA: Insufficient documentation

## 2015-01-03 DIAGNOSIS — T148XXA Other injury of unspecified body region, initial encounter: Secondary | ICD-10-CM

## 2015-01-03 HISTORY — DX: Type 2 diabetes mellitus without complications: E11.9

## 2015-01-03 MED ORDER — CYCLOBENZAPRINE HCL 10 MG PO TABS: 10.0000 mg | ORAL_TABLET | Freq: Three times a day (TID) | ORAL | Status: DC | PRN

## 2015-01-03 MED ORDER — HYDROCODONE-ACETAMINOPHEN 5-325 MG PO TABS: 1.0000 | ORAL_TABLET | ORAL | Status: DC | PRN

## 2015-01-03 MED ORDER — IBUPROFEN 800 MG PO TABS
800.0000 mg | ORAL_TABLET | Freq: Three times a day (TID) | ORAL | Status: DC | PRN
Start: 2015-01-03 — End: 2015-11-14

## 2015-01-03 NOTE — ED Notes (Signed)
Pt  States  back pain started when she fell down on the steps of her home and hit her lower back this morning at around 8.  She states she has pain in her left shoulder as well. Pt states there is pain on walking and standing.

## 2015-01-03 NOTE — ED Provider Notes (Signed)
[facility] Emergency Department Provider Note  ____________________________________________  Time seen: Approximately 1:35 PM  I have reviewed the triage vital signs and the nursing notes.   HISTORY  Chief Complaint Back Pain    HPI Wendy Frazier is a 44 y.o. female who presents to the emergency room for evaluation of back pain. Patient states that she fell down steps of her house and hit her back on the wall. She states that she can walk and stand but it's painful.   Past Medical History  Diagnosis Date   Diabetes mellitus without complication     Patient Active Problem List   Diagnosis Date Noted   Chest pain 07/28/2014    History reviewed. No pertinent past surgical history.  Current Outpatient Rx  Name  Route  Sig  Dispense  Refill   cyclobenzaprine (FLEXERIL) 10 MG tablet   Oral   Take 1 tablet (10 mg total) by mouth every 8 (eight) hours as needed for muscle spasms.   30 tablet   1    HYDROcodone-acetaminophen (NORCO) 5-325 MG per tablet   Oral   Take 1-2 tablets by mouth every 4 (four) hours as needed for moderate pain.   15 tablet   0    ibuprofen (ADVIL,MOTRIN) 800 MG tablet   Oral   Take 1 tablet (800 mg total) by mouth every 8 (eight) hours as needed.   30 tablet   0     Allergies Morphine and related  No family history on file.  Social History History  Substance Use Topics   Smoking status: Never Smoker    Smokeless tobacco: Not on file   Alcohol Use: No    Review of Systems Constitutional: No fever/chills Eyes: No visual changes. ENT: No sore throat. Cardiovascular: Denies chest pain. Respiratory: Denies shortness of breath. Gastrointestinal: No abdominal pain.  No nausea, no vomiting.  No diarrhea.  No constipation. Genitourinary: Negative for dysuria. Musculoskeletal: Positive for back pain Skin: Negative for rash. Neurological: Negative for headaches, focal weakness or numbness.  10-point ROS  otherwise negative.  ____________________________________________   PHYSICAL EXAM:  VITAL SIGNS: ED Triage Vitals  Enc Vitals Group     BP 01/03/15 1251 128/91 mmHg     Pulse Rate 01/03/15 1251 91     Resp 01/03/15 1251 20     Temp 01/03/15 1251 98.5 F (36.9 C)     Temp Source 01/03/15 1251 Oral     SpO2 01/03/15 1251 100 %     Weight 01/03/15 1251 190 lb (86.183 kg)     Height 01/03/15 1251 5\' 4"  (1.626 m)     Head Cir --      Peak Flow --      Pain Score 01/03/15 1252 8     Pain Loc --      Pain Edu? --      Excl. in [place]? --     Constitutional: Alert and oriented. Well appearing and in no acute distress. Eyes: Conjunctivae are normal. PERRL. EOMI. Head: Atraumatic. Nose: No congestion/rhinnorhea. Mouth/Throat: Mucous membranes are moist.  Oropharynx non-erythematous. Neck: No stridor. No cervical spine tenderness to palpation. Hematological/Lymphatic/Immunilogical: No cervical lymphadenopathy. Cardiovascular: Normal rate, regular rhythm. Grossly normal heart sounds.  Good peripheral circulation. Respiratory: Normal respiratory effort.  No retractions. Lungs CTAB. Gastrointestinal: Soft and nontender. No distention. No abdominal bruits. No CVA tenderness. Musculoskeletal: No lower extremity tenderness nor edema.  No joint effusions. Neurologic:  Normal speech and language. No gross focal  neurologic deficits are appreciated. Speech is normal. No gait instability. Skin:  Skin is warm, dry and intact. No rash noted. Psychiatric: Mood and affect are normal. Speech and behavior are normal.  ____________________________________________   LABS (all labs ordered are listed, but only abnormal results are displayed)  Labs Reviewed - No data to display ____________________________________________  EKG  Deferred ____________________________________________  RADIOLOGY  Negative for fracture ____________________________________________   PROCEDURES  Procedure(s)  performed: None  Critical Care performed: No  ____________________________________________   INITIAL IMPRESSION / ASSESSMENT AND PLAN / ED COURSE  Pertinent labs & imaging results that were available during my care of the patient were reviewed by me and considered in my medical decision making (see chart for details).  Discussed post fall lumbar and thoracic myofascial strains. Rx given for ibuprofen and Flexeril. Patient to follow up with PCP or return to the ER if symptoms worsen. No other emergency medical complaints at this visit. ____________________________________________   FINAL CLINICAL IMPRESSION(S) / ED DIAGNOSES  Final diagnoses:  Fall against object, initial encounter  Contusion      Arlyss Repress, PA-C 01/03/15 1729  Orbie Pyo, MD 01/04/15 [phone]

## 2015-01-03 NOTE — Discharge Instructions (Signed)

## 2015-01-03 NOTE — ED Notes (Signed)
Back and L shoulder pain

## 2015-01-03 NOTE — ED Notes (Signed)
Assessment agreed per this nurse

## 2015-03-05 LAB — BASIC METABOLIC PANEL: CREATININE: 0.9 mg/dL (ref <=1.1)

## 2015-03-23 ENCOUNTER — Ambulatory Visit: Payer: Self-pay | Admitting: Internal Medicine

## 2015-03-23 DIAGNOSIS — D649 Anemia, unspecified: Secondary | ICD-10-CM | POA: Insufficient documentation

## 2015-03-23 LAB — MICROALBUMIN, URINE: MICROALB UR: 36.1

## 2015-03-23 LAB — HEPATIC FUNCTION PANEL: BILIRUBIN DIRECT: 0.2 mg/dL (ref 0.01–0.4)

## 2015-03-23 LAB — BASIC METABOLIC PANEL
BUN: 10 mg/dL (ref 4–21)
Sodium: 137 mmol/L (ref 137–147)

## 2015-03-23 LAB — TSH: TSH: 1.97 u[IU]/mL (ref 0.41–5.90)

## 2015-03-30 ENCOUNTER — Ambulatory Visit: Payer: Self-pay | Admitting: Internal Medicine

## 2015-03-30 DIAGNOSIS — E119 Type 2 diabetes mellitus without complications: Secondary | ICD-10-CM | POA: Insufficient documentation

## 2015-04-12 ENCOUNTER — Encounter: Payer: Self-pay | Admitting: Emergency Medicine

## 2015-04-12 ENCOUNTER — Emergency Department
Admission: EM | Admit: 2015-04-12 | Discharge: 2015-04-12 | Disposition: A | Discharge status: Home / Self Care | Payer: Self-pay | Attending: Student | Admitting: Student

## 2015-04-12 DIAGNOSIS — E119 Type 2 diabetes mellitus without complications: Secondary | ICD-10-CM | POA: Insufficient documentation

## 2015-04-12 DIAGNOSIS — G43909 Migraine, unspecified, not intractable, without status migrainosus: Secondary | ICD-10-CM | POA: Insufficient documentation

## 2015-04-12 DIAGNOSIS — R51 Headache: Secondary | ICD-10-CM

## 2015-04-12 DIAGNOSIS — R519 Headache, unspecified: Secondary | ICD-10-CM

## 2015-04-12 HISTORY — DX: Migraine, unspecified, not intractable, without status migrainosus: G43.909

## 2015-04-12 MED ORDER — SUMATRIPTAN SUCCINATE 6 MG/0.5ML ~~LOC~~ SOLN
6.0000 mg | Freq: Once | SUBCUTANEOUS | Status: AC
  Administered 2015-04-12: 6 mg via SUBCUTANEOUS
  Filled 2015-04-12: qty 0.5

## 2015-04-12 MED ORDER — KETOROLAC TROMETHAMINE 60 MG/2ML IM SOLN
60.0000 mg | Freq: Once | INTRAMUSCULAR | Status: AC
  Administered 2015-04-12: 60 mg via INTRAMUSCULAR
  Filled 2015-04-12: qty 2

## 2015-04-12 MED ORDER — METOCLOPRAMIDE HCL 10 MG PO TABS
10.0000 mg | ORAL_TABLET | Freq: Once | ORAL | Status: AC
  Administered 2015-04-12: 10 mg via ORAL
  Filled 2015-04-12: qty 1

## 2015-04-12 MED ORDER — ORPHENADRINE CITRATE 30 MG/ML IJ SOLN
60.0000 mg | INTRAMUSCULAR | Status: AC
  Administered 2015-04-12: 60 mg via INTRAMUSCULAR
  Filled 2015-04-12: qty 2

## 2015-04-12 MED ORDER — KETOROLAC TROMETHAMINE 10 MG PO TABS: 10.0000 mg | ORAL_TABLET | Freq: Three times a day (TID) | ORAL | Status: DC

## 2015-04-12 MED ORDER — DIPHENHYDRAMINE HCL 50 MG PO CAPS
50.0000 mg | ORAL_CAPSULE | Freq: Once | ORAL | Status: AC
  Administered 2015-04-12: 50 mg via ORAL
  Filled 2015-04-12: qty 1

## 2015-04-12 NOTE — ED Provider Notes (Signed)
[facility] Emergency Department Provider Note ____________________________________________  Time seen: 1040  I have reviewed the triage vital signs and the nursing notes.  HISTORY  Chief Complaint  Headache  HPI Wendy Frazier is a 44 y.o. female reports to the ED of treatment of migraine headache for the last 4 hours. She describes her typical migraine pattern, except for early onset of nausea. She also notes associated photophobia. She has dosed Tylenol Migraine without significant relief, due to vomiting. She reports her pain at a 10/10 in triage. She arrived via city bus.   Past Medical History  Diagnosis Date   Diabetes mellitus without complication    Migraines     Patient Active Problem List   Diagnosis Date Noted   Chest pain 07/28/2014    History reviewed. No pertinent past surgical history.  Current Outpatient Rx  Name  Route  Sig  Dispense  Refill   cyclobenzaprine (FLEXERIL) 10 MG tablet   Oral   Take 1 tablet (10 mg total) by mouth every 8 (eight) hours as needed for muscle spasms.   30 tablet   1    HYDROcodone-acetaminophen (NORCO) 5-325 MG per tablet   Oral   Take 1-2 tablets by mouth every 4 (four) hours as needed for moderate pain.   15 tablet   0    ibuprofen (ADVIL,MOTRIN) 800 MG tablet   Oral   Take 1 tablet (800 mg total) by mouth every 8 (eight) hours as needed.   30 tablet   0    ketorolac (TORADOL) 10 MG tablet   Oral   Take 1 tablet (10 mg total) by mouth every 8 (eight) hours.   15 tablet   0    Allergies Morphine and related  History reviewed. No pertinent family history.  Social History Social History  Substance Use Topics   Smoking status: Never Smoker    Smokeless tobacco: None   Alcohol Use: No   Review of Systems  Constitutional: Negative for fever. Eyes: Negative for visual changes. ENT: Negative for sore throat. Cardiovascular: Negative for chest pain. Respiratory: Negative for  shortness of breath. Gastrointestinal: Negative for abdominal pain, vomiting and diarrhea. Genitourinary: Negative for dysuria. Musculoskeletal: Negative for back pain. Skin: Negative for rash. Neurological: Negative for focal weakness or numbness. Reports headaches ____________________________________________  PHYSICAL EXAM:  VITAL SIGNS: ED Triage Vitals  Enc Vitals Group     BP 04/12/15 1017 127/85 mmHg     Pulse Rate 04/12/15 1017 89     Resp 04/12/15 1017 20     Temp 04/12/15 1017 98.6 F (37 C)     Temp Source 04/12/15 1017 Oral     SpO2 04/12/15 1017 100 %     Weight 04/12/15 1017 186 lb (84.369 kg)     Height 04/12/15 1017 5\' 4"  (1.626 m)     Head Cir --      Peak Flow --      Pain Score 04/12/15 1018 10     Pain Loc --      Pain Edu? --      Excl. in [place]? --    Constitutional: Alert and oriented. Well appearing and in no distress. Eyes: Conjunctivae are normal. PERRL. Normal extraocular movements. ENT   Head: Normocephalic and atraumatic.   Nose: No congestion/rhinorrhea.   Mouth/Throat: Mucous membranes are moist.   Neck: Supple. No thyromegaly. Hematological/Lymphatic/Immunological: No cervical lymphadenopathy. Cardiovascular: Normal rate, regular rhythm.  Respiratory: Normal respiratory effort. No wheezes/rales/rhonchi. Gastrointestinal:  Soft and nontender. No distention. Musculoskeletal: Nontender with normal range of motion in all extremities.  Neurologic: Cranial nerves II through XII grossly intact. Normal intrinsic and opposition testing. No indication of cerebellar ataxia. Normal gait without ataxia. Normal speech and language. No gross focal neurologic deficits are appreciated. Skin:  Skin is warm, dry and intact. No rash noted. Psychiatric: Mood and affect are normal. Patient exhibits appropriate insight and judgment. ____________________________________________  PROCEDURES  Toradol 60 mg IM Norflex 60 mg IM Imitrex 6 mg SQ Reglan 10  mg PO Benadryl 50 mg PO ____________________________________________  INITIAL IMPRESSION / ASSESSMENT AND PLAN / ED COURSE  Acute migraine presentation, without indication of neuromuscular deficit. Prescription Toradol as needed for headache pain. Follow-up with primary provider as needed. Return as needed.  ____________________________________________  FINAL CLINICAL IMPRESSION(S) / ED DIAGNOSES  Final diagnoses:  Acute nonintractable headache, unspecified headache type      Melvenia Needles, PA-C 04/12/15 1305  Joanne Gavel, MD 04/12/15 1600

## 2015-04-12 NOTE — Discharge Instructions (Signed)
General Headache Without Cause A headache is pain or discomfort felt around the head or neck area. The specific cause of a headache may not be found. There are many causes and types of headaches. A few common ones are:  Tension headaches.  Migraine headaches.  Cluster headaches.  Chronic daily headaches. HOME CARE INSTRUCTIONS   Keep all follow-up appointments with your caregiver or any specialist referral.  Only take over-the-counter or prescription medicines for pain or discomfort as directed by your caregiver.  Lie down in a dark, quiet room when you have a headache.  Keep a headache journal to find out what may trigger your migraine headaches. For example, write down:  What you eat and drink.  How much sleep you get.  Any change to your diet or medicines.  Try massage or other relaxation techniques.  Put ice packs or heat on the head and neck. Use these 3 to 4 times per day for 15 to 20 minutes each time, or as needed.  Limit stress.  Sit up straight, and do not tense your muscles.  Quit smoking if you smoke.  Limit alcohol use.  Decrease the amount of caffeine you drink, or stop drinking caffeine.  Eat and sleep on a regular schedule.  Get 7 to 9 hours of sleep, or as recommended by your caregiver.  Keep lights dim if bright lights bother you and make your headaches worse. SEEK MEDICAL CARE IF:   You have problems with the medicines you were prescribed.  Your medicines are not working.  You have a change from the usual headache.  You have nausea or vomiting. SEEK IMMEDIATE MEDICAL CARE IF:   Your headache becomes severe.  You have a fever.  You have a stiff neck.  You have loss of vision.  You have muscular weakness or loss of muscle control.  You start losing your balance or have trouble walking.  You feel faint or pass out.  You have severe symptoms that are different from your first symptoms. MAKE SURE YOU:   Understand these  instructions.  Will watch your condition.  Will get help right away if you are not doing well or get worse. Document Released: 07/09/2005 Document Revised: 10/01/2011 Document Reviewed: 07/25/2011 [facility] Patient Information 2015 [place], [place]. This information is not intended to replace advice given to you by your health care provider. Make sure you discuss any questions you have with your health care provider.   Your exam is normal today. You should take the prescription medicine as directed. Follow-up with your provider as needed.

## 2015-04-12 NOTE — ED Notes (Signed)
Pt to ed with headache x 1 month. Pt states she has had approx 4 in the last month.  States +photosensitivity, nausea and vomiting associated with the headaches.

## 2015-04-12 NOTE — ED Notes (Signed)
Headache for the past 3 days .The patient complains nausea and vomiting with photosensitivity. Pt is on bed with room darken

## 2015-04-27 ENCOUNTER — Ambulatory Visit: Payer: Self-pay | Admitting: Internal Medicine

## 2015-04-27 LAB — CBC AND DIFFERENTIAL
Neutrophils Absolute: 4 /uL
WBC: 6.4 10*3/mL

## 2015-04-27 LAB — BASIC METABOLIC PANEL: Glucose: 323 mg/dL

## 2015-04-27 LAB — HEMOGLOBIN A1C: Hemoglobin A1C: 10.9

## 2015-05-06 ENCOUNTER — Other Ambulatory Visit: Payer: Self-pay | Admitting: Internal Medicine

## 2015-05-06 DIAGNOSIS — R05 Cough: Secondary | ICD-10-CM

## 2015-05-06 DIAGNOSIS — J40 Bronchitis, not specified as acute or chronic: Secondary | ICD-10-CM

## 2015-05-06 DIAGNOSIS — R059 Cough, unspecified: Secondary | ICD-10-CM

## 2015-06-29 ENCOUNTER — Other Ambulatory Visit: Payer: Self-pay

## 2015-07-06 ENCOUNTER — Ambulatory Visit: Payer: Self-pay | Admitting: Internal Medicine

## 2015-08-09 ENCOUNTER — Ambulatory Visit: Payer: Self-pay

## 2015-08-19 ENCOUNTER — Emergency Department
Admission: EM | Admit: 2015-08-19 | Discharge: 2015-08-19 | Disposition: A | Discharge status: Home / Self Care | Payer: Self-pay | Attending: Emergency Medicine | Admitting: Emergency Medicine

## 2015-08-19 ENCOUNTER — Encounter: Payer: Self-pay | Admitting: Emergency Medicine

## 2015-08-19 ENCOUNTER — Emergency Department: Payer: Self-pay

## 2015-08-19 DIAGNOSIS — Z3202 Encounter for pregnancy test, result negative: Secondary | ICD-10-CM | POA: Insufficient documentation

## 2015-08-19 DIAGNOSIS — R350 Frequency of micturition: Secondary | ICD-10-CM | POA: Insufficient documentation

## 2015-08-19 DIAGNOSIS — E119 Type 2 diabetes mellitus without complications: Secondary | ICD-10-CM | POA: Insufficient documentation

## 2015-08-19 DIAGNOSIS — B86 Scabies: Secondary | ICD-10-CM | POA: Insufficient documentation

## 2015-08-19 DIAGNOSIS — R109 Unspecified abdominal pain: Secondary | ICD-10-CM

## 2015-08-19 LAB — CBC
HEMATOCRIT: 35.6 % (ref 35.0–47.0)
HEMOGLOBIN: 12.1 g/dL (ref 12.0–16.0)
MCH: 26.6 pg (ref 26.0–34.0)
MCHC: 33.9 g/dL (ref 32.0–36.0)
MCV: 78.3 fL — AB (ref 80.0–100.0)
Platelets: 308 10*3/uL (ref 150–440)
RBC: 4.54 MIL/uL (ref 3.80–5.20)
RDW: 13.8 % (ref 11.5–14.5)
WBC: 7.6 10*3/uL (ref 3.6–11.0)

## 2015-08-19 LAB — BASIC METABOLIC PANEL
Anion gap: 7 (ref 5–15)
BUN: 12 mg/dL (ref 6–20)
CHLORIDE: 105 mmol/L (ref 101–111)
CO2: 23 mmol/L (ref 22–32)
Calcium: 8.7 mg/dL — ABNORMAL LOW (ref 8.9–10.3)
Creatinine, Ser: 0.82 mg/dL (ref 0.44–1.00)
GFR calc Af Amer: >60 mL/min (ref >60)
Glucose, Bld: 252 mg/dL — ABNORMAL HIGH (ref 65–99)
POTASSIUM: 4.2 mmol/L (ref 3.5–5.1)
Sodium: 135 mmol/L (ref 135–145)

## 2015-08-19 LAB — URINALYSIS COMPLETE WITH MICROSCOPIC (ARMC ONLY)
BILIRUBIN URINE: NEGATIVE
Bacteria, UA: NONE SEEN
Hgb urine dipstick: NEGATIVE
Ketones, ur: NEGATIVE mg/dL
LEUKOCYTES UA: NEGATIVE
Nitrite: NEGATIVE
Protein, ur: 30 mg/dL — AB
Specific Gravity, Urine: 1.021 (ref 1.005–1.030)
pH: 6 (ref 5.0–8.0)

## 2015-08-19 LAB — PREGNANCY, URINE: Preg Test, Ur: NEGATIVE

## 2015-08-19 MED ORDER — CYCLOBENZAPRINE HCL 10 MG PO TABS: 10.0000 mg | ORAL_TABLET | Freq: Three times a day (TID) | ORAL | Status: DC | PRN

## 2015-08-19 MED ORDER — PERMETHRIN 5 % EX CREA: 1.0000 "application " | TOPICAL_CREAM | Freq: Once | CUTANEOUS | Status: DC

## 2015-08-19 MED ORDER — IBUPROFEN 800 MG PO TABS: 800.0000 mg | ORAL_TABLET | Freq: Three times a day (TID) | ORAL | Status: DC | PRN

## 2015-08-19 NOTE — ED Notes (Signed)
Patient presents to the ED with left flank pain and urinary frequency.  Patient reports history of diabetes.  Patient states pain is sharp and intermittent.  Patient denies history of kidney stones.  [facility]

## 2015-08-19 NOTE — ED Provider Notes (Signed)
[facility] Emergency Department Provider Note     Time seen: ----------------------------------------- 2:51 PM on 08/19/2015 -----------------------------------------    I have reviewed the triage vital signs and the nursing notes.   HISTORY  Chief Complaint Flank Pain and Urinary Frequency    HPI Wendy Frazier is a 45 y.o. female who presents to ER with left flank pain and urinary frequency. Patient reports a history of diabetes also, states the pain is sharp and intermittent. Patient denies any fevers or chills, has had a rash that itches intensely on her arms and hands. Nothing makes her symptoms better or worse.   Past Medical History  Diagnosis Date   Diabetes mellitus without complication ([place])    Migraines     Patient Active Problem List   Diagnosis Date Noted   Chest pain 07/28/2014    History reviewed. No pertinent past surgical history.  Allergies Morphine and related  Social History Social History  Substance Use Topics   Smoking status: Never Smoker    Smokeless tobacco: None   Alcohol Use: No    Review of Systems Constitutional: Negative for fever. Eyes: Negative for visual changes. ENT: Negative for sore throat. Cardiovascular: Negative for chest pain. Respiratory: Negative for shortness of breath. Gastrointestinal: Positive for flank pain Genitourinary: Negative for dysuria. Musculoskeletal: Negative for back pain. Skin: Positive for pruritic rash Neurological: Negative for headaches, focal weakness or numbness.  10-point ROS otherwise negative.  ____________________________________________   PHYSICAL EXAM:  VITAL SIGNS: ED Triage Vitals  Enc Vitals Group     BP 08/19/15 1341 156/94 mmHg     Pulse Rate 08/19/15 1341 91     Resp 08/19/15 1341 18     Temp 08/19/15 1341 98.2 F (36.8 C)     Temp Source 08/19/15 1341 Oral     SpO2 08/19/15 1341 98 %     Weight 08/19/15 1341 194 lb (87.998 kg)      Height 08/19/15 1341 5\' 4"  (1.626 m)     Head Cir --      Peak Flow --      Pain Score 08/19/15 1342 6     Pain Loc --      Pain Edu? --      Excl. in [place]? --     Constitutional: Alert and oriented. Well appearing and in no distress. Eyes: Conjunctivae are normal. PERRL. Normal extraocular movements. ENT   Head: Normocephalic and atraumatic.   Nose: No congestion/rhinnorhea.   Mouth/Throat: Mucous membranes are moist.   Neck: No stridor. Cardiovascular: Normal rate, regular rhythm. Normal and symmetric distal pulses are present in all extremities. No murmurs, rubs, or gallops. Respiratory: Normal respiratory effort without tachypnea nor retractions. Breath sounds are clear and equal bilaterally. No wheezes/rales/rhonchi. Gastrointestinal: Soft and nontender. No distention. No abdominal bruits.  Musculoskeletal: Nontender with normal range of motion in all extremities. No joint effusions.  No lower extremity tenderness nor edema. Neurologic:  Normal speech and language. No gross focal neurologic deficits are appreciated. Speech is normal. No gait instability. Skin: Small erythematous rash particularly over the forearms and hands with burrows evident. There is some excoriation as well Psychiatric: Mood and affect are normal. Speech and behavior are normal. Patient exhibits appropriate insight and judgment. ____________________________________________  ED COURSE:  Pertinent labs & imaging results that were available during my care of the patient were reviewed by me and considered in my medical decision making (see chart for details). Patient is in no acute distress, clinically  has scabies. We'll assess for possible kidney stones. ____________________________________________    LABS (pertinent positives/negatives)  Labs Reviewed  URINALYSIS COMPLETEWITH MICROSCOPIC ([facility] ONLY) - Abnormal; Notable for the following:    Color, Urine YELLOW (*)    APPearance CLEAR (*)     Glucose, UA >500 (*)    Protein, ur 30 (*)    Squamous Epithelial / LPF 6-30 (*)    All other components within normal limits  CBC - Abnormal; Notable for the following:    MCV 78.3 (*)    All other components within normal limits  BASIC METABOLIC PANEL - Abnormal; Notable for the following:    Glucose, Bld 252 (*)    Calcium 8.7 (*)    All other components within normal limits  PREGNANCY, URINE    RADIOLOGY  KUB IMPRESSION: No acute abnormality noted. ____________________________________________  FINAL ASSESSMENT AND PLAN  Flank pain, scabies  Plan: Patient with labs and imaging as dictated above. Flank pain seems to be more axillary pain and musculoskeletal in origin. She is in no acute distress, does have scabies clinically and we'll cover for same. I will prescribe muscle relaxants as needed for the flank pain.   Earleen Newport, MD   Earleen Newport, MD 08/19/15 [id number]

## 2015-08-19 NOTE — Discharge Instructions (Signed)
Flank Pain Flank pain refers to pain that is located on the side of the body between the upper abdomen and the back. The pain may occur over a short period of time (acute) or may be long-term or reoccurring (chronic). It may be mild or severe. Flank pain can be caused by many things. CAUSES  Some of the more common causes of flank pain include:  Muscle strains.   Muscle spasms.   A disease of your spine (vertebral disk disease).   A lung infection (pneumonia).   Fluid around your lungs (pulmonary edema).   A kidney infection.   Kidney stones.   A very painful skin rash caused by the chickenpox virus (shingles).   Gallbladder disease.  [place] care will depend on the cause of your pain. In general,  Rest as directed by your caregiver.  Drink enough fluids to keep your urine clear or pale yellow.  Only take over-the-counter or prescription medicines as directed by your caregiver. Some medicines may help relieve the pain.  Tell your caregiver about any changes in your pain.  Follow up with your caregiver as directed. SEEK IMMEDIATE MEDICAL CARE IF:   Your pain is not controlled with medicine.   You have new or worsening symptoms.  Your pain increases.   You have abdominal pain.   You have shortness of breath.   You have persistent nausea or vomiting.   You have swelling in your abdomen.   You feel faint or pass out.   You have blood in your urine.  You have a fever or persistent symptoms for more than 2-3 days.  You have a fever and your symptoms suddenly get worse. MAKE SURE YOU:   Understand these instructions.  Will watch your condition.  Will get help right away if you are not doing well or get worse.   This information is not intended to replace advice given to you by your health care provider. Make sure you discuss any questions you have with your health care provider.   Document Released: 08/30/2005 Document  Revised: 04/02/2012 Document Reviewed: 02/21/2012 Elsevier Interactive Patient Education 2016 [employer].  Scabies, Adult Scabies is a skin condition that happens when very small insects get under the skin (infestation). This causes a rash and severe itchiness. Scabies can spread from person to person (is contagious). If you get scabies, it is common for others in your household to get scabies too. With proper treatment, symptoms usually go away in 2-4 weeks. Scabies usually does not cause lasting problems. CAUSES This condition is caused by mites (Sarcoptes scabiei, or human itch mites) that can only be seen with a microscope. The mites get into the top layer of skin and lay eggs. Scabies can spread from person to person through:  Close contact with a person who has scabies.  Contact with infested items, such as towels, bedding, or clothing. RISK FACTORS This condition is more likely to develop in:  People who live in nursing homes and other extended-care facilities.  People who have sexual contact with a partner who has scabies.  Young children who attend child care facilities.  People who care for others who are at increased risk for scabies. SYMPTOMS Symptoms of this condition may include:  Severe itchiness. This is often worse at night.  A rash that includes tiny red bumps or blisters. The rash commonly occurs on the wrist, elbow, armpit, fingers, waist, groin, or buttocks. Bumps may form a line (burrow) in  some areas.  Skin irritation. This can include scaly patches or sores. DIAGNOSIS This condition is diagnosed with a physical exam. Your health care provider will look closely at your skin. In some cases, your health care provider may take a sample of your affected skin (skin scraping) and have it examined under a microscope. TREATMENT This condition may be treated with:  Medicated cream or lotion that kills the mites. This is spread on the entire body and left on for  several hours. Usually, one treatment with medicated cream or lotion is enough to kill all of the mites. In severe cases, the treatment may be repeated.  Medicated cream that relieves itching.  Medicines that help to relieve itching.  Medicines that kill the mites. This treatment is rarely used. HOME CARE INSTRUCTIONS Medicines  Take or apply over-the-counter and prescription medicines as told by your health care provider.  Apply medicated cream or lotion as told by your health care provider.  Do not wash off the medicated cream or lotion until the necessary amount of time has passed. Skin Care  Avoid scratching your affected skin.  Keep your fingernails closely trimmed to reduce injury from scratching.  Take cool baths or apply cool washcloths to help reduce itching. General Instructions  Clean all items that you recently had contact with, including bedding, clothing, and furniture. Do this on the same day that your treatment starts.  Use hot water when you wash items.  Place unwashable items into closed, airtight plastic bags for at least 3 days. The mites cannot live for more than 3 days away from human skin.  Vacuum furniture and mattresses that you use.  Make sure that other people who may have been infested are examined by a health care provider. These include members of your household and anyone who may have had contact with infested items.  Keep all follow-up visits as told by your health care provider. This is important. SEEK MEDICAL CARE IF:  You have itching that does not go away after 4 weeks of treatment.  You continue to develop new bumps or burrows.  You have redness, swelling, or pain in your rash area after treatment.  You have fluid, blood, or pus coming from your rash.   This information is not intended to replace advice given to you by your health care provider. Make sure you discuss any questions you have with your health care provider.   Document  Released: 03/30/2015 Document Reviewed: 02/08/2015 Elsevier Interactive Patient Education [employer].

## 2015-10-04 ENCOUNTER — Encounter: Payer: Self-pay | Admitting: Intensive Care

## 2015-10-04 ENCOUNTER — Emergency Department
Admission: EM | Admit: 2015-10-04 | Discharge: 2015-10-04 | Disposition: A | Discharge status: Home / Self Care | Payer: Self-pay | Attending: Emergency Medicine | Admitting: Emergency Medicine

## 2015-10-04 ENCOUNTER — Emergency Department: Payer: Self-pay

## 2015-10-04 DIAGNOSIS — R739 Hyperglycemia, unspecified: Secondary | ICD-10-CM

## 2015-10-04 DIAGNOSIS — Z79899 Other long term (current) drug therapy: Secondary | ICD-10-CM | POA: Insufficient documentation

## 2015-10-04 DIAGNOSIS — E1165 Type 2 diabetes mellitus with hyperglycemia: Secondary | ICD-10-CM | POA: Insufficient documentation

## 2015-10-04 LAB — CBC
HEMATOCRIT: 38.3 % (ref 35.0–47.0)
HEMOGLOBIN: 13 g/dL (ref 12.0–16.0)
MCH: 26.3 pg (ref 26.0–34.0)
MCHC: 34 g/dL (ref 32.0–36.0)
MCV: 77.5 fL — ABNORMAL LOW (ref 80.0–100.0)
Platelets: 282 10*3/uL (ref 150–440)
RBC: 4.94 MIL/uL (ref 3.80–5.20)
RDW: 14.2 % (ref 11.5–14.5)
WBC: 6.9 10*3/uL (ref 3.6–11.0)

## 2015-10-04 LAB — COMPREHENSIVE METABOLIC PANEL
ALBUMIN: 3.7 g/dL (ref 3.5–5.0)
ALK PHOS: 80 U/L (ref 38–126)
ALT: 13 U/L — ABNORMAL LOW (ref 14–54)
AST: 17 U/L (ref 15–41)
Anion gap: 7 (ref 5–15)
BUN: 12 mg/dL (ref 6–20)
CALCIUM: 8.4 mg/dL — AB (ref 8.9–10.3)
CO2: 23 mmol/L (ref 22–32)
Chloride: 101 mmol/L (ref 101–111)
Creatinine, Ser: 0.88 mg/dL (ref 0.44–1.00)
GFR calc Af Amer: >60 mL/min (ref >60)
GFR calc non Af Amer: >60 mL/min (ref >60)
GLUCOSE: 342 mg/dL — AB (ref 65–99)
POTASSIUM: 3.8 mmol/L (ref 3.5–5.1)
Sodium: 131 mmol/L — ABNORMAL LOW (ref 135–145)
Total Bilirubin: 0.4 mg/dL (ref 0.3–1.2)
Total Protein: 7.4 g/dL (ref 6.5–8.1)

## 2015-10-04 LAB — GLUCOSE, CAPILLARY: Glucose-Capillary: 222 mg/dL — ABNORMAL HIGH (ref 65–99)

## 2015-10-04 MED ORDER — METFORMIN HCL 500 MG PO TABS
500.0000 mg | ORAL_TABLET | Freq: Every day | ORAL | Status: DC
Start: 2015-10-04 — End: 2016-06-14

## 2015-10-04 MED ORDER — SODIUM CHLORIDE 0.9 % IV SOLN
1000.0000 mL | Freq: Once | INTRAVENOUS | Status: AC
  Administered 2015-10-04: 1000 mL via INTRAVENOUS

## 2015-10-04 MED ORDER — ACETAMINOPHEN 325 MG PO TABS
650.0000 mg | ORAL_TABLET | Freq: Once | ORAL | Status: AC
  Administered 2015-10-04: 650 mg via ORAL
  Filled 2015-10-04: qty 2

## 2015-10-04 NOTE — ED Provider Notes (Signed)
[facility] Emergency Department Provider Note  ____________________________________________    I have reviewed the triage vital signs and the nursing notes.   HISTORY  Chief Complaint Hyperglycemia    HPI Wendy Frazier is a 45 y.o. female who presents with blurriness in her left eye. She states it feels like she is looking through water. No pain. Patient reports she believes this is because her sugar has gotten high because this is happened in the past. Patient reports she has has diabetes since she was 45 years old but has "never taken anything for it". She denies nausea or vomiting. No headache. No focal weakness. No sensory changes, otherwise she reports she feels well.     Past Medical History  Diagnosis Date   Diabetes mellitus without complication ([place])    Migraines     Patient Active Problem List   Diagnosis Date Noted   Chest pain 07/28/2014    No past surgical history on file.  Current Outpatient Rx  Name  Route  Sig  Dispense  Refill   cyclobenzaprine (FLEXERIL) 10 MG tablet   Oral   Take 1 tablet (10 mg total) by mouth every 8 (eight) hours as needed for muscle spasms.   30 tablet   1    cyclobenzaprine (FLEXERIL) 10 MG tablet   Oral   Take 1 tablet (10 mg total) by mouth every 8 (eight) hours as needed for muscle spasms.   30 tablet   1    HYDROcodone-acetaminophen (NORCO) 5-325 MG per tablet   Oral   Take 1-2 tablets by mouth every 4 (four) hours as needed for moderate pain.   15 tablet   0    ibuprofen (ADVIL,MOTRIN) 800 MG tablet   Oral   Take 1 tablet (800 mg total) by mouth every 8 (eight) hours as needed.   30 tablet   0    ibuprofen (ADVIL,MOTRIN) 800 MG tablet   Oral   Take 1 tablet (800 mg total) by mouth every 8 (eight) hours as needed.   30 tablet   0    ketorolac (TORADOL) 10 MG tablet   Oral   Take 1 tablet (10 mg total) by mouth every 8 (eight) hours.   15 tablet   0    permethrin  (ELIMITE) 5 % cream   Topical   Apply 1 application topically once.   60 g   0     Allergies Morphine and related  No family history on file.  Social History Social History  Substance Use Topics   Smoking status: Never Smoker    Smokeless tobacco: Not on file   Alcohol Use: No    Review of Systems  Constitutional: Negative for fever. Eyes: Blurry vision in left eye as above ENT: Negative for sore throat Cardiovascular: Negative for chest pain Respiratory: Negative for cough Gastrointestinal: Negative for abdominal pain, no nausea or vomiting Genitourinary: Negative for dysuria. Musculoskeletal: Negative for back pain. Skin: Negative for rash. Neurological: Negative for focal weakness Psychiatric: no anxiety    ____________________________________________   PHYSICAL EXAM:  VITAL SIGNS: ED Triage Vitals  Enc Vitals Group     BP --      Pulse --      Resp --      Temp --      Temp src --      SpO2 --      Weight --      Height --  Head Cir --      Peak Flow --      Pain Score --      Pain Loc --      Pain Edu? --      Excl. in [place]? --      Constitutional: Alert and oriented. Well appearing and in no distress.  Eyes: Conjunctivae are normal. No erythema or injection, PERRLA, EOMI, Limited funduscopic exam unremarkable ENT   Head: Normocephalic and atraumatic.   Mouth/Throat: Mucous membranes are moist. Cardiovascular: Normal rate, regular rhythm. Normal and symmetric distal pulses are present in the upper extremities. Respiratory: Normal respiratory effort without tachypnea nor retractions. Breath sounds are clear and equal bilaterally.  Gastrointestinal: Soft and non-tender in all quadrants. No distention. There is no CVA tenderness. Genitourinary: deferred Musculoskeletal: Nontender with normal range of motion in all extremities. No lower extremity tenderness nor edema. Neurologic:  Normal speech and language. No gross focal neurologic  deficits are appreciated. Skin:  Skin is warm, dry and intact. No rash noted. Psychiatric: Mood and affect are normal. Patient exhibits appropriate insight and judgment.  ____________________________________________    LABS (pertinent positives/negatives)  Labs Reviewed  CBC  COMPREHENSIVE METABOLIC PANEL    ____________________________________________   EKG  None  ____________________________________________    RADIOLOGY    ____________________________________________   PROCEDURES  Procedure(s) performed: none  Critical Care performed: none  ____________________________________________   INITIAL IMPRESSION / ASSESSMENT AND PLAN / ED COURSE  Pertinent labs & imaging results that were available during my care of the patient were reviewed by me and considered in my medical decision making (see chart for details).  Given patient prescription of blurriness hyperglycemia certainly a possibility. We will give IV fluids, check labs and reevaluate. Differential also includes retinal/vitreous detachment/macular degeneration/glaucoma   Patient's symptoms resolved rapidly with resolution of her hyperglycemia. Visual acuity is normal. Okay for discharge and outpatient follow-up. Strongly encouraged taking medication for her diabetes and wrote a prescription for metformin ____________________________________________   FINAL CLINICAL IMPRESSION(S) / ED DIAGNOSES  Final diagnoses:  Hyperglycemia          Lavonia Drafts, MD 10/04/15 1253

## 2015-10-04 NOTE — ED Notes (Signed)
Patient transported to CT °

## 2015-10-04 NOTE — ED Notes (Addendum)
Patient arrived by EMS from work. Patient started having blurry vision at work x2 hours. Patient has HX of diabetes and reported she takes no medicine for her diabetes. Patient stated "when i get blurry vision is usally lasts around 10 minutes and then goes away. This time it has lasted 2 hours" EMS blood sugar is 381.

## 2015-10-04 NOTE — Discharge Instructions (Signed)
Hyperglycemia °High blood sugar (hyperglycemia) means that the level of sugar in your blood is higher than it should be. Signs of high blood sugar include: °· Feeling thirsty. °· Frequent peeing (urinating). °· Feeling tired or sleepy. °· Dry mouth. °· Vision changes. °· Feeling weak. °· Feeling hungry but losing weight. °· Numbness and tingling in your hands or feet. °· Headache. °When you ignore these signs, your blood sugar may keep going up. These problems may get worse, and other problems may begin. °HOME CARE °· Check your blood sugars as told by your doctor. Write down the numbers with the date and time. °· Take the right amount of insulin or diabetes pills at the right time. Write down the dose with date and time. °· Refill your insulin or diabetes pills before running out. °· Watch what you eat. Follow your meal plan. °· Drink liquids without sugar, such as water. Check with your doctor if you have kidney or heart disease. °· Follow your doctor's orders for exercise. Exercise at the same time of day. °· Keep your doctor's appointments. °GET HELP RIGHT AWAY IF:  °· You have trouble thinking or are confused. °· You have fast breathing with fruity smelling breath. °· You pass out (faint). °· You have 2 to 3 days of high blood sugars and you do not know why. °· You have chest pain. °· You are feeling sick to your stomach (nauseous) or throwing up (vomiting). °· You have sudden vision changes. °MAKE SURE YOU:  °· Understand these instructions. °· Will watch your condition. °· Will get help right away if you are not doing well or get worse. °  °This information is not intended to replace advice given to you by your health care provider. Make sure you discuss any questions you have with your health care provider. °  °Document Released: 05/06/2009 Document Revised: 07/30/2014 Document Reviewed: 03/15/2015 °Elsevier Interactive Patient Education ©2016 Elsevier Inc. ° °

## 2015-11-09 ENCOUNTER — Emergency Department
Admission: EM | Admit: 2015-11-09 | Discharge: 2015-11-09 | Disposition: A | Discharge status: Home / Self Care | Payer: Self-pay | Attending: Emergency Medicine | Admitting: Emergency Medicine

## 2015-11-09 DIAGNOSIS — Z7984 Long term (current) use of oral hypoglycemic drugs: Secondary | ICD-10-CM | POA: Insufficient documentation

## 2015-11-09 DIAGNOSIS — E119 Type 2 diabetes mellitus without complications: Secondary | ICD-10-CM | POA: Insufficient documentation

## 2015-11-09 DIAGNOSIS — L0231 Cutaneous abscess of buttock: Secondary | ICD-10-CM | POA: Insufficient documentation

## 2015-11-09 DIAGNOSIS — Z791 Long term (current) use of non-steroidal anti-inflammatories (NSAID): Secondary | ICD-10-CM | POA: Insufficient documentation

## 2015-11-09 MED ORDER — SULFAMETHOXAZOLE-TRIMETHOPRIM 800-160 MG PO TABS
1.0000 | ORAL_TABLET | Freq: Once | ORAL | Status: AC
  Administered 2015-11-09: 1 via ORAL
  Filled 2015-11-09: qty 1

## 2015-11-09 MED ORDER — LIDOCAINE HCL (PF) 1 % IJ SOLN
INTRAMUSCULAR | Status: AC
  Administered 2015-11-09: 5 mL
  Filled 2015-11-09: qty 5

## 2015-11-09 MED ORDER — SULFAMETHOXAZOLE-TRIMETHOPRIM 800-160 MG PO TABS: 1.0000 | ORAL_TABLET | Freq: Two times a day (BID) | ORAL | Status: DC

## 2015-11-09 NOTE — ED Notes (Signed)
Patient ambulatory to triage with steady gait, without difficulty or distress noted; pt reports abscess to left buttock x 2 days

## 2015-11-09 NOTE — Discharge Instructions (Signed)
Abscess °An abscess is an infected area that contains a collection of pus and debris. It can occur in almost any part of the body. An abscess is also known as a furuncle or boil. °CAUSES  °An abscess occurs when tissue gets infected. This can occur from blockage of oil or sweat glands, infection of hair follicles, or a minor injury to the skin. As the body tries to fight the infection, pus collects in the area and creates pressure under the skin. This pressure causes pain. People with weakened immune systems have difficulty fighting infections and get certain abscesses more often.  °SYMPTOMS °Usually an abscess develops on the skin and becomes a painful mass that is red, warm, and tender. If the abscess forms under the skin, you may feel a moveable soft area under the skin. Some abscesses break open (rupture) on their own, but most will continue to get worse without care. The infection can spread deeper into the body and eventually into the bloodstream, causing you to feel ill.  °DIAGNOSIS  °Your caregiver will take your medical history and perform a physical exam. A sample of fluid may also be taken from the abscess to determine what is causing your infection. °TREATMENT  °Your caregiver may prescribe antibiotic medicines to fight the infection. However, taking antibiotics alone usually does not cure an abscess. Your caregiver may need to make a small cut (incision) in the abscess to drain the pus. In some cases, gauze is packed into the abscess to reduce pain and to continue draining the area. °HOME CARE INSTRUCTIONS  °· Only take over-the-counter or prescription medicines for pain, discomfort, or fever as directed by your caregiver. °· If you were prescribed antibiotics, take them as directed. Finish them even if you start to feel better. °· If gauze is used, follow your caregiver's directions for changing the gauze. °· To avoid spreading the infection: °· Keep your draining abscess covered with a  bandage. °· Wash your hands well. °· Do not share personal care items, towels, or whirlpools with others. °· Avoid skin contact with others. °· Keep your skin and clothes clean around the abscess. °· Keep all follow-up appointments as directed by your caregiver. °SEEK MEDICAL CARE IF:  °· You have increased pain, swelling, redness, fluid drainage, or bleeding. °· You have muscle aches, chills, or a general ill feeling. °· You have a fever. °MAKE SURE YOU:  °· Understand these instructions. °· Will watch your condition. °· Will get help right away if you are not doing well or get worse. °  °This information is not intended to replace advice given to you by your health care provider. Make sure you discuss any questions you have with your health care provider. °  °Document Released: 04/18/2005 Document Revised: 01/08/2012 Document Reviewed: 09/21/2011 °Elsevier Interactive Patient Education ©2016 Elsevier Inc. ° °Incision and Drainage °Incision and drainage is a procedure in which a sac-like structure (cystic structure) is opened and drained. The area to be drained usually contains material such as pus, fluid, or blood.  °LET YOUR CAREGIVER KNOW ABOUT:  °· Allergies to medicine. °· Medicines taken, including vitamins, herbs, eyedrops, over-the-counter medicines, and creams. °· Use of steroids (by mouth or creams). °· Previous problems with anesthetics or numbing medicines. °· History of bleeding problems or blood clots. °· Previous surgery. °· Other health problems, including diabetes and kidney problems. °· Possibility of pregnancy, if this applies. °RISKS AND COMPLICATIONS °· Pain. °· Bleeding. °· Scarring. °· Infection. °BEFORE THE PROCEDURE  °  You may need to have an ultrasound or other imaging tests to see how large or deep your cystic structure is. Blood tests may also be used to determine if you have an infection or how severe the infection is. You may need to have a tetanus shot. °PROCEDURE  °The affected area  is cleaned with a cleaning fluid. The cyst area will then be numbed with a medicine (local anesthetic). A small incision will be made in the cystic structure. A syringe or catheter may be used to drain the contents of the cystic structure, or the contents may be squeezed out. The area will then be flushed with a cleansing solution. After cleansing the area, it is often gently packed with a gauze or another wound dressing. Once it is packed, it will be covered with gauze and tape or some other type of wound dressing.  °AFTER THE PROCEDURE  °· Often, you will be allowed to go home right after the procedure. °· You may be given antibiotic medicine to prevent or heal an infection. °· If the area was packed with gauze or some other wound dressing, you will likely need to come back in 1 to 2 days to get it removed. °· The area should heal in about 14 days. °  °This information is not intended to replace advice given to you by your health care provider. Make sure you discuss any questions you have with your health care provider. °  °Document Released: 01/02/2001 Document Revised: 01/08/2012 Document Reviewed: 09/03/2011 °Elsevier Interactive Patient Education ©2016 Elsevier Inc. ° °

## 2015-11-09 NOTE — ED Provider Notes (Signed)
[facility] Emergency Department Provider Note  ____________________________________________  Time seen: 5:50 AM  I have reviewed the triage vital signs and the nursing notes.   HISTORY  Chief Complaint Abscess      HPI Wendy Frazier is a 46 y.o. female history diabetes and migraines presents with "gluteal abscess 2 days. Patient admits to previous abscesses. Patient denies any history of MRSA. Patient denies any fever.     Past Medical History  Diagnosis Date   Diabetes mellitus without complication ([place])    Migraines     Patient Active Problem List   Diagnosis Date Noted   Chest pain 07/28/2014    No past surgical history on file.  Current Outpatient Rx  Name  Route  Sig  Dispense  Refill   cyclobenzaprine (FLEXERIL) 10 MG tablet   Oral   Take 1 tablet (10 mg total) by mouth every 8 (eight) hours as needed for muscle spasms. Patient not taking: Reported on 10/04/2015   30 tablet   1    cyclobenzaprine (FLEXERIL) 10 MG tablet   Oral   Take 1 tablet (10 mg total) by mouth every 8 (eight) hours as needed for muscle spasms. Patient not taking: Reported on 10/04/2015   30 tablet   1    HYDROcodone-acetaminophen (NORCO) 5-325 MG per tablet   Oral   Take 1-2 tablets by mouth every 4 (four) hours as needed for moderate pain. Patient not taking: Reported on 10/04/2015   15 tablet   0    ibuprofen (ADVIL,MOTRIN) 800 MG tablet   Oral   Take 1 tablet (800 mg total) by mouth every 8 (eight) hours as needed. Patient not taking: Reported on 10/04/2015   30 tablet   0    ibuprofen (ADVIL,MOTRIN) 800 MG tablet   Oral   Take 1 tablet (800 mg total) by mouth every 8 (eight) hours as needed. Patient not taking: Reported on 10/04/2015   30 tablet   0    ketorolac (TORADOL) 10 MG tablet   Oral   Take 1 tablet (10 mg total) by mouth every 8 (eight) hours. Patient not taking: Reported on 10/04/2015   15 tablet   0    metFORMIN  (GLUCOPHAGE) 500 MG tablet   Oral   Take 1 tablet (500 mg total) by mouth daily with breakfast.   30 tablet   11    permethrin (ELIMITE) 5 % cream   Topical   Apply 1 application topically once. Patient not taking: Reported on 10/04/2015   60 g   0     Allergies Morphine and related  No family history on file.  Social History Social History  Substance Use Topics   Smoking status: Never Smoker    Smokeless tobacco: Never Used   Alcohol Use: No    Review of Systems  Constitutional: Negative for fever. Eyes: Negative for visual changes. ENT: Negative for sore throat. Cardiovascular: Negative for chest pain. Respiratory: Negative for shortness of breath. Gastrointestinal: Negative for abdominal pain, vomiting and diarrhea. Genitourinary: Negative for dysuria. Musculoskeletal: Negative for back pain. Skin: Negative for rash.Positive for abscess Neurological: Negative for headaches, focal weakness or numbness.   10-point ROS otherwise negative.  ____________________________________________   PHYSICAL EXAM:  VITAL SIGNS: ED Triage Vitals  Enc Vitals Group     BP 11/09/15 0546 121/82 mmHg     Pulse Rate 11/09/15 0546 102     Resp 11/09/15 0546 20     Temp 11/09/15  0546 98.3 F (36.8 C)     Temp Source 11/09/15 0546 Oral     SpO2 11/09/15 0546 98 %     Weight 11/09/15 0546 198 lb (89.812 kg)     Height 11/09/15 0546 5\' 3"  (1.6 m)     Head Cir --      Peak Flow --      Pain Score 11/09/15 0545 10     Pain Loc --      Pain Edu? --      Excl. in [place]? --      Constitutional: Alert and oriented. Well appearing and in no distress. Eyes: Conjunctivae are normal. PERRL. Normal extraocular movements. ENT   Head: Normocephalic and atraumatic.   Nose: No congestion/rhinnorhea.   Mouth/Throat: Mucous membranes are moist.   Neck: No stridor. Hematological/Lymphatic/Immunilogical: No cervical lymphadenopathy. Cardiovascular: Normal rate, regular  rhythm. Normal and symmetric distal pulses are present in all extremities. No murmurs, rubs, or gallops. Respiratory: Normal respiratory effort without tachypnea nor retractions. Breath sounds are clear and equal bilaterally. No wheezes/rales/rhonchi. Gastrointestinal: Soft and nontender. No distention. There is no CVA tenderness. Genitourinary: deferred Musculoskeletal: Nontender with normal range of motion in all extremities. No joint effusions.  No lower extremity tenderness nor edema. Neurologic:  Normal speech and language. No gross focal neurologic deficits are appreciated. Speech is normal.  Skin:  3 x 3 cm area left inferior gluteal fold with Erythema and fluctuance Psychiatric: Mood and affect are normal. Speech and behavior are normal. Patient exhibits appropriate insight and judgment.  ____  PROCEDURES  Procedure(s) performed: INCISION AND DRAINAGE Performed by: Gregor Hams Consent: Verbal consent obtained. Risks and benefits: risks, benefits and alternatives were discussed Type: abscess  Body area: Left Inferior gluteal fold Anesthesia: local infiltration  Incision was made with a scalpel.  Local anesthetic: lidocaine 1%   Anesthetic total: 8ml  Complexity: complex Blunt dissection to break up loculations  Drainage: purulent  Drainage amount: 80ml  Packing material: 1/4 in iodoform gauze  Patient tolerance: Patient tolerated the procedure well with no immediate complications.      ____________________________________________   INITIAL IMPRESSION / ASSESSMENT AND PLAN / ED COURSE  Pertinent labs & imaging results that were available during my care of the patient were reviewed by me and considered in my medical decision making (see chart for details).  I&D performed approximately 5 cc of purulent drainage obtained. Patient received Bactrim DS 1 tablet will be prescribed same for home. Patient is advised to return to the emergency department if  worsening pain fever increasing area of redness or any emergency medical concerns.  ____________________________________________   FINAL CLINICAL IMPRESSION(S) / ED DIAGNOSES  Final diagnoses:  Abscess, gluteal, left      Gregor Hams, MD 11/09/15 [id number]

## 2015-11-14 ENCOUNTER — Encounter: Payer: Self-pay | Admitting: Emergency Medicine

## 2015-11-14 ENCOUNTER — Emergency Department
Admission: EM | Admit: 2015-11-14 | Discharge: 2015-11-14 | Disposition: A | Discharge status: Home / Self Care | Payer: Self-pay | Attending: Emergency Medicine | Admitting: Emergency Medicine

## 2015-11-14 DIAGNOSIS — K611 Rectal abscess: Secondary | ICD-10-CM | POA: Insufficient documentation

## 2015-11-14 DIAGNOSIS — E119 Type 2 diabetes mellitus without complications: Secondary | ICD-10-CM | POA: Insufficient documentation

## 2015-11-14 DIAGNOSIS — Z7984 Long term (current) use of oral hypoglycemic drugs: Secondary | ICD-10-CM | POA: Insufficient documentation

## 2015-11-14 DIAGNOSIS — Z885 Allergy status to narcotic agent status: Secondary | ICD-10-CM | POA: Insufficient documentation

## 2015-11-14 MED ORDER — CLINDAMYCIN HCL 300 MG PO CAPS: 300.0000 mg | ORAL_CAPSULE | Freq: Three times a day (TID) | ORAL | Status: DC

## 2015-11-14 MED ORDER — IBUPROFEN 800 MG PO TABS: 800.0000 mg | ORAL_TABLET | Freq: Three times a day (TID) | ORAL | Status: DC | PRN

## 2015-11-14 NOTE — ED Provider Notes (Signed)
[facility] Emergency Department Provider Note  ____________________________________________  Time seen: Approximately 12:57 PM  I have reviewed the triage vital signs and the nursing notes.   HISTORY  Chief Complaint Recurrent Skin Infections    HPI Wendy Frazier is a 45 y.o. female, NAD, presents to the emergency department with a recurring left gluteal abscess. She states she came to the emergency room last Wednesday where she says it was drained. She was prescribed Bactrim at that visit for which she states she has taken the entire course without any side effects. The pain has continued to worsen since the I&D last week. Denies fever, chills, body aches. Has had no loss of bowel or bladder control. No paresthesias in the saddle region.   Past Medical History  Diagnosis Date   Diabetes mellitus without complication ([place])    Migraines     Patient Active Problem List   Diagnosis Date Noted   Chest pain 07/28/2014    History reviewed. No pertinent past surgical history.  Current Outpatient Rx  Name  Route  Sig  Dispense  Refill   clindamycin (CLEOCIN) 300 MG capsule   Oral   Take 1 capsule (300 mg total) by mouth 3 (three) times daily.   30 capsule   0    ibuprofen (ADVIL,MOTRIN) 800 MG tablet   Oral   Take 1 tablet (800 mg total) by mouth every 8 (eight) hours as needed (pain).   21 tablet   0    metFORMIN (GLUCOPHAGE) 500 MG tablet   Oral   Take 1 tablet (500 mg total) by mouth daily with breakfast.   30 tablet   11     Allergies Morphine and related  No family history on file.  Social History Social History  Substance Use Topics   Smoking status: Never Smoker    Smokeless tobacco: Never Used   Alcohol Use: No     Review of Systems Constitutional: No fever/chills, fatigue Cardiovascular: No chest pain. Respiratory:  No shortness of breath.  Gastrointestinal: No abdominal pain.  No nausea, vomiting.  No  diarrhea. Musculoskeletal: Negative for general myalgias.  Skin: Abscess and pain in left buttocks. Negative for rash. Neurological: Negative for headaches, focal weakness or numbness. 10-point ROS otherwise negative.  ____________________________________________   PHYSICAL EXAM:  VITAL SIGNS: ED Triage Vitals  Enc Vitals Group     BP 11/14/15 1200 141/83 mmHg     Pulse Rate 11/14/15 1200 97     Resp 11/14/15 1200 16     Temp 11/14/15 1200 99 F (37.2 C)     Temp Source 11/14/15 1200 Oral     SpO2 11/14/15 1200 98 %     Weight 11/14/15 1158 195 lb (88.451 kg)     Height 11/14/15 1158 5\' 3"  (1.6 m)     Head Cir --      Peak Flow --      Pain Score 11/14/15 1158 10     Pain Loc --      Pain Edu? --      Excl. in [place]? --    Constitutional: Alert and oriented. Well appearing and in no acute distress. Eyes: Conjunctivae are normal.  Head: Atraumatic. Respiratory: Normal respiratory effort without tachypnea or retractions. Neurologic:  Normal speech and language. No gross focal neurologic deficits are appreciated.  Skin:  2 cm annular fluctuant lesion in the left perirectal gluteal fold, erythema and edema about the area. Yellow oozing noted from the centralized  area. Skin is warm, dry and intact. No rash noted. Psychiatric: Mood and affect are normal. Speech and behavior are normal. Patient exhibits appropriate insight and judgement.   ____________________________________________   LABS  None ____________________________________________   RADIOLOGY  None ____________________________________________    PROCEDURES  Procedure(s) performed: INCISION AND DRAINAGE Performed by: Braxton Feathers Consent: Verbal consent obtained. Risks and benefits: risks, benefits and alternatives were discussed Type: abscess  Body area: Perirectal, left buttock  Anesthesia: local infiltration  Incision was made with a scalpel.  Local anesthetic: lidocaine 1% with  epinephrine  Anesthetic total: 5 ml  Complexity: complex Blunt dissection to break up loculations  Drainage: purulent  Drainage amount: 3cc  Packing material: None  Patient tolerance: Patient tolerated the procedure well with no immediate complications.      Medications - No data to display   ____________________________________________   INITIAL IMPRESSION / ASSESSMENT AND PLAN / ED COURSE  Patient's diagnosis is consistent with perirectal abscess. 1.3 cm incision was made so that complete evacuation of the abscess could occur naturally. Patient will be discharged home with prescriptions for clindamycin and ibuprofen to take as directed. Patient is to follow up with Dr. Burt Knack at [facility] if symptoms persist past this treatment course or recur. Patient is given ED precautions to return to the ED for any worsening or new symptoms.      ____________________________________________  FINAL CLINICAL IMPRESSION(S) / ED DIAGNOSES  Final diagnoses:  Perirectal abscess      NEW MEDICATIONS STARTED DURING THIS VISIT:  New Prescriptions   CLINDAMYCIN (CLEOCIN) 300 MG CAPSULE    Take 1 capsule (300 mg total) by mouth 3 (three) times daily.   IBUPROFEN (ADVIL,MOTRIN) 800 MG TABLET    Take 1 tablet (800 mg total) by mouth every 8 (eight) hours as needed (pain).        Braxton Feathers, PA-C 11/14/15 1431  Orbie Pyo, MD 11/14/15 [phone]

## 2015-11-14 NOTE — ED Notes (Signed)
Boil to left buttock cheek, was seen here last week for the same.

## 2015-11-14 NOTE — Discharge Instructions (Signed)
Perirectal Abscess An abscess is an infected area that contains a collection of pus. A perirectal abscess is an abscess that is near the opening of the anus or around the rectum. A perirectal abscess can cause a lot of pain, especially during bowel movements. CAUSES This condition is almost always caused by an infection that starts in an anal gland. RISK FACTORS This condition is more likely to develop in:  People with diabetes or inflammatory bowel disease.  People whose body defense system (immune system) is weak.  People who have anal sex.  People who have a sexually transmitted disease (STD).  People who have certain kinds of cancers, such as rectal carcinoma, leukemia, or lymphoma. SYMPTOMS The main symptom of this condition is pain. The pain may be a throbbing pain that gets worse during bowel movements. Other symptoms include:  Fever.  Swelling.  Redness.  Bleeding.  Constipation. DIAGNOSIS The condition is diagnosed with a physical exam. If the abscess is not visible, a health care provider may need to place a finger inside the rectum to find the abscess. Sometimes, imaging tests are done to determine the size and location of the abscess. These tests may include:  An ultrasound.  An MRI.  A CT scan. TREATMENT This condition is usually treated with incision and drainage surgery. Incision and drainage surgery involves making an incision over the abscess to drain the pus. Treatment may also involve antibiotic medicine, pain medicine, stool softeners, or laxatives. HOME CARE INSTRUCTIONS  Take medicines only as directed by your health care provider.  If you were prescribed an antibiotic, finish all of it even if you start to feel better.  To relieve pain, try sitting:  In a warm, shallow bath (sitz bath).  On a heating pad with the setting on low.  On an inflatable donut-shaped cushion.  Follow any diet instructions as directed by your health care  provider.  Keep all follow-up visits as directed by your health care provider. This is important. SEEK MEDICAL CARE IF:  Your abscess is bleeding.  You have pain, swelling, or redness that is getting worse.  You are constipated.  You feel ill.  You have muscle aches or chills.  You have a fever.  Your symptoms return after the abscess has healed.   This information is not intended to replace advice given to you by your health care provider. Make sure you discuss any questions you have with your health care provider.   Document Released: 07/06/2000 Document Revised: 03/30/2015 Document Reviewed: 05/19/2014 Elsevier Interactive Patient Education 2016 Elsevier Inc.  

## 2015-12-23 ENCOUNTER — Emergency Department: Payer: Self-pay

## 2015-12-23 ENCOUNTER — Emergency Department
Admission: EM | Admit: 2015-12-23 | Discharge: 2015-12-23 | Disposition: A | Discharge status: Home / Self Care | Payer: Self-pay | Attending: Student | Admitting: Student

## 2015-12-23 DIAGNOSIS — E119 Type 2 diabetes mellitus without complications: Secondary | ICD-10-CM | POA: Insufficient documentation

## 2015-12-23 DIAGNOSIS — Z7984 Long term (current) use of oral hypoglycemic drugs: Secondary | ICD-10-CM | POA: Insufficient documentation

## 2015-12-23 DIAGNOSIS — M25522 Pain in left elbow: Secondary | ICD-10-CM

## 2015-12-23 DIAGNOSIS — R52 Pain, unspecified: Secondary | ICD-10-CM

## 2015-12-23 MED ORDER — NAPROXEN 500 MG PO TBEC: 500.0000 mg | DELAYED_RELEASE_TABLET | Freq: Two times a day (BID) | ORAL | Status: DC
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
## 2015-12-23 NOTE — Discharge Instructions (Signed)
Joint Pain Joint pain can be caused by many things. The joint can be bruised, infected, weak from aging, or sore from exercise. The pain will probably go away if you follow your doctor's instructions for home care. If your joint pain continues, more tests may be needed to help find the cause of your condition. HOME CARE Watch your condition for any changes. Follow these instructions as told to lessen the pain that you are feeling:  Take medicines only as told by your doctor.  Rest the sore joint for as long as told by your doctor. If your doctor tells you to, raise (elevate) the painful joint above the level of your heart while you are sitting or lying down.  Do not do things that cause pain or make the pain worse.  If told, put ice on the painful area:  Put ice in a plastic bag.  Place a towel between your skin and the bag.  Leave the ice on for 20 minutes, 2-3 times per day.  Wear an elastic bandage, splint, or sling as told by your doctor. Loosen the bandage or splint if your fingers or toes lose feeling (become numb) and tingle, or if they turn cold and blue.  Begin exercising or stretching the joint as told by your doctor. Ask your doctor what types of exercise are safe for you.  Keep all follow-up visits as told by your doctor. This is important. GET HELP IF:  Your pain gets worse and medicine does not help it.  Your joint pain does not get better in 3 days.  You have more bruising or swelling.  You have a fever.  You lose 10 pounds (4.5 kg) or more without trying. GET HELP RIGHT AWAY IF:  You are not able to move the joint.  Your fingers or toes become numb or they turn cold and blue.   This information is not intended to replace advice given to you by your health care provider. Make sure you discuss any questions you have with your health care provider.   Document Released: 06/27/2009 Document Revised: 07/30/2014 Document Reviewed: 04/20/2014 Elsevier Interactive  Patient Education 2016 [employer]  Musculoskeletal Pain Musculoskeletal pain is muscle and boney aches and pains. These pains can occur in any part of the body. Your caregiver may treat you without knowing the cause of the pain. They may treat you if blood or urine tests, X-rays, and other tests were normal.  CAUSES There is often not a definite cause or reason for these pains. These pains may be caused by a type of germ (virus). The discomfort may also come from overuse. Overuse includes working out too hard when your body is not fit. Boney aches also come from weather changes. Bone is sensitive to atmospheric pressure changes. HOME CARE INSTRUCTIONS   Ask when your test results will be ready. Make sure you get your test results.  Only take over-the-counter or prescription medicines for pain, discomfort, or fever as directed by your caregiver. If you were given medications for your condition, do not drive, operate machinery or power tools, or sign legal documents for 24 hours. Do not drink alcohol. Do not take sleeping pills or other medications that may interfere with treatment.  Continue all activities unless the activities cause more pain. When the pain lessens, slowly resume normal activities. Gradually increase the intensity and duration of the activities or exercise.  During periods of severe pain, bed rest may be helpful. Lay or sit in any  position that is comfortable.  Putting ice on the injured area.  Put ice in a bag.  Place a towel between your skin and the bag.  Leave the ice on for 15 to 20 minutes, 3 to 4 times a day.  Follow up with your caregiver for continued problems and no reason can be found for the pain. If the pain becomes worse or does not go away, it may be necessary to repeat tests or do additional testing. Your caregiver may need to look further for a possible cause. SEEK IMMEDIATE MEDICAL CARE IF:  You have pain that is getting worse and is not relieved by  medications.  You develop chest pain that is associated with shortness or breath, sweating, feeling sick to your stomach (nauseous), or throw up (vomit).  Your pain becomes localized to the abdomen.  You develop any new symptoms that seem different or that concern you. MAKE SURE YOU:   Understand these instructions.  Will watch your condition.  Will get help right away if you are not doing well or get worse.   This information is not intended to replace advice given to you by your health care provider. Make sure you discuss any questions you have with your health care provider.   Document Released: 07/09/2005 Document Revised: 10/01/2011 Document Reviewed: 03/13/2013 Elsevier Interactive Patient Education 2016 [place] the ace wrap as needed. Apply ice for pain and swelling. Take the prescription pain medicine as needed. Follow-up with [facility] as needed.

## 2015-12-23 NOTE — ED Provider Notes (Signed)
[facility] Emergency Department Provider Note ____________________________________________  Time seen: 0819  I have reviewed the triage vital signs and the nursing notes.  HISTORY  Chief Complaint  Arm Pain  HPI Wendy Frazier is a 45 y.o. female presents to the ED with left elbow pain without recent injury, trauma, or accident. She describes that she had an left arm injury secondary to a fall with accident several years ago, which is reported as 10 years to the radiologist. She denies any fracture or dislocation at that time. She says she has intermittent problems with the left arm. She describes yesterday onset of pain to the left elbow with difficulty with fully extending the elbow. There is no reports of any distal paresthesias, grip changes, or swelling.She rates her discomfort a 7/10 in triage.  Past Medical History  Diagnosis Date   Diabetes mellitus without complication ([place])    Migraines     Patient Active Problem List   Diagnosis Date Noted   Chest pain 07/28/2014    No past surgical history on file.  Current Outpatient Rx  Name  Route  Sig  Dispense  Refill   clindamycin (CLEOCIN) 300 MG capsule   Oral   Take 1 capsule (300 mg total) by mouth 3 (three) times daily.   30 capsule   0    ibuprofen (ADVIL,MOTRIN) 800 MG tablet   Oral   Take 1 tablet (800 mg total) by mouth every 8 (eight) hours as needed (pain).   21 tablet   0    metFORMIN (GLUCOPHAGE) 500 MG tablet   Oral   Take 1 tablet (500 mg total) by mouth daily with breakfast.   30 tablet   11    naproxen (EC NAPROSYN) 500 MG EC tablet   Oral   Take 1 tablet (500 mg total) by mouth 2 (two) times daily with a meal.   30 tablet   0     Allergies Morphine and related  No family history on file.  Social History Social History  Substance Use Topics   Smoking status: Never Smoker    Smokeless tobacco: Never Used   Alcohol Use: No    Review of  Systems  Constitutional: Negative for fever. Musculoskeletal: Negative for back pain. Left elbow pain and disability as above. Skin: Negative for rash. Neurological: Negative for headaches, focal weakness or numbness. ____________________________________________  PHYSICAL EXAM:  VITAL SIGNS: ED Triage Vitals  Enc Vitals Group     BP 12/23/15 0758 151/90 mmHg     Pulse Rate 12/23/15 0758 91     Resp 12/23/15 0758 18     Temp 12/23/15 0758 98.9 F (37.2 C)     Temp Source 12/23/15 0758 Oral     SpO2 12/23/15 0758 98 %     Weight 12/23/15 0758 196 lb (88.905 kg)     Height 12/23/15 0758 5\' 4"  (1.626 m)     Head Cir --      Peak Flow --      Pain Score 12/23/15 0758 7     Pain Loc --      Pain Edu? --      Excl. in [place]? --    Constitutional: Alert and oriented. Well appearing and in no distress. Head: Normocephalic and atraumatic.   Cardiovascular: Normal distal pulses and cap refill.  Respiratory: Normal respiratory effort. Musculoskeletal: Left elbow without obvious deformity, dislocation, joint effusion. Patient with normal active range of motion and self-limited active extension  range. Passively I'm able to show full range of motion at the elbow without deficit. Normal composite fist and grip strength. Nontender with normal range of motion in all extremities.  Neurologic:  Normal gait without ataxia. Normal speech and language. No gross focal neurologic deficits are appreciated. Skin:  Skin is warm, dry and intact. No rash noted. ____________________________________________   RADIOLOGY  Left Elbow 2V  IMPRESSION: No acute finding by plain radiography  I, Sherian Valenza, Dannielle Karvonen, personally viewed and evaluated these images (plain radiographs) as part of my medical decision making, as well as reviewing the written report by the radiologist. ____________________________________________  PROCEDURES  Ace bandage ____________________________________________  INITIAL  IMPRESSION / ASSESSMENT AND PLAN / ED COURSE  Patient with elbow pain without recent injury or radiologic evidence of fracture or dislocation. She be discharged with a prescription for EC Naprosyn to dose as directed. She'll follow up with [facility] for [facility] symptom management. She is instructed on RICE management of her soft tissue injury. ____________________________________________  FINAL CLINICAL IMPRESSION(S) / ED DIAGNOSES  Final diagnoses:  Elbow joint pain, left     Melvenia Needles, PA-C 12/23/15 1623  Joanne Gavel, MD 12/23/15 1655

## 2015-12-23 NOTE — ED Notes (Signed)
States she has had an old injury to left arm several years ago. But states she has intermittent pain to same elbow  But this time the pain is worse and movement is decreased  Positive pulses good sensation

## 2016-01-23 DIAGNOSIS — D649 Anemia, unspecified: Secondary | ICD-10-CM

## 2016-01-23 DIAGNOSIS — E119 Type 2 diabetes mellitus without complications: Secondary | ICD-10-CM

## 2016-04-20 ENCOUNTER — Emergency Department
Admission: EM | Admit: 2016-04-20 | Discharge: 2016-04-20 | Disposition: A | Discharge status: Home / Self Care | Payer: Self-pay | Attending: Emergency Medicine | Admitting: Emergency Medicine

## 2016-04-20 ENCOUNTER — Encounter: Payer: Self-pay | Admitting: Emergency Medicine

## 2016-04-20 ENCOUNTER — Emergency Department: Payer: Self-pay

## 2016-04-20 DIAGNOSIS — E119 Type 2 diabetes mellitus without complications: Secondary | ICD-10-CM | POA: Insufficient documentation

## 2016-04-20 DIAGNOSIS — F419 Anxiety disorder, unspecified: Secondary | ICD-10-CM

## 2016-04-20 DIAGNOSIS — F410 Panic disorder [episodic paroxysmal anxiety] without agoraphobia: Secondary | ICD-10-CM | POA: Insufficient documentation

## 2016-04-20 DIAGNOSIS — Z7984 Long term (current) use of oral hypoglycemic drugs: Secondary | ICD-10-CM | POA: Insufficient documentation

## 2016-04-20 LAB — CBC WITH DIFFERENTIAL/PLATELET
BASOS ABS: 0.1 10*3/uL (ref 0–0.1)
BASOS PCT: 1 %
EOS ABS: 0.2 10*3/uL (ref 0–0.7)
Eosinophils Relative: 3 %
HCT: 37.8 % (ref 35.0–47.0)
HEMOGLOBIN: 13.2 g/dL (ref 12.0–16.0)
Lymphocytes Relative: 23 %
Lymphs Abs: 2 10*3/uL (ref 1.0–3.6)
MCH: 27.6 pg (ref 26.0–34.0)
MCHC: 34.9 g/dL (ref 32.0–36.0)
MCV: 79 fL — ABNORMAL LOW (ref 80.0–100.0)
MONO ABS: 0.5 10*3/uL (ref 0.2–0.9)
MONOS PCT: 6 %
NEUTROS PCT: 67 %
Neutro Abs: 5.9 10*3/uL (ref 1.4–6.5)
Platelets: 278 10*3/uL (ref 150–440)
RBC: 4.79 MIL/uL (ref 3.80–5.20)
RDW: 13.7 % (ref 11.5–14.5)
WBC: 8.6 10*3/uL (ref 3.6–11.0)

## 2016-04-20 LAB — TROPONIN I

## 2016-04-20 LAB — COMPREHENSIVE METABOLIC PANEL
ALBUMIN: 3.5 g/dL (ref 3.5–5.0)
ALT: 12 U/L — AB (ref 14–54)
AST: 17 U/L (ref 15–41)
Alkaline Phosphatase: 77 U/L (ref 38–126)
Anion gap: 8 (ref 5–15)
BUN: 14 mg/dL (ref 6–20)
CHLORIDE: 102 mmol/L (ref 101–111)
CO2: 23 mmol/L (ref 22–32)
CREATININE: 0.93 mg/dL (ref 0.44–1.00)
Calcium: 8.8 mg/dL — ABNORMAL LOW (ref 8.9–10.3)
GFR calc non Af Amer: >60 mL/min (ref >60)
Glucose, Bld: 373 mg/dL — ABNORMAL HIGH (ref 65–99)
Potassium: 4 mmol/L (ref 3.5–5.1)
SODIUM: 133 mmol/L — AB (ref 135–145)
Total Bilirubin: 0.5 mg/dL (ref 0.3–1.2)
Total Protein: 7.3 g/dL (ref 6.5–8.1)

## 2016-04-20 LAB — FIBRIN DERIVATIVES D-DIMER (ARMC ONLY): Fibrin derivatives D-dimer (ARMC): 391 (ref 0–499)

## 2016-04-20 MED ORDER — LORAZEPAM 1 MG PO TABS: 1.0000 mg | ORAL_TABLET | Freq: Two times a day (BID) | ORAL | 0 refills | Status: DC | PRN

## 2016-04-20 MED ORDER — LORAZEPAM 2 MG/ML IJ SOLN
1.0000 mg | Freq: Once | INTRAMUSCULAR | Status: AC
  Administered 2016-04-20: 1 mg via INTRAVENOUS
  Filled 2016-04-20: qty 1

## 2016-04-20 NOTE — Discharge Instructions (Signed)
Please return immediately if condition worsens. Please contact her primary physician or the physician you were given for referral. If you have any specialist physicians involved in her treatment and plan please also contact them. Thank you for using [facility]. ° °

## 2016-04-20 NOTE — ED Triage Notes (Signed)
Pt here with c/o midsternal chest pressure that began this am during a panic attack at work, pt states she has a hx of panic attacks, tearful and anxious in triage.

## 2016-04-20 NOTE — ED Provider Notes (Signed)
Time Seen: Approximately  0805  I have reviewed the triage notes  Chief Complaint: Chest Pain and Panic Attack   History of Present Illness: Wendy Frazier is a 45 y.o. female who presents with acute onset of some substernal chest discomfort without radiation. Patient states it feels like a panic attack and she's had similar attacks before in the past. She states she is currently not under any new stress at this time. She had some nausea and vomited 1. She denies any feelings of shortness of breath or pleuritic pain in nature. He states he is currently not on any medication other than metformin. She does not take any antidepressants or antianxiety agents. She denies any illicit drug usage. She states she had a treadmill test approximately a year ago which was negative. She denies any abdominal pain, melena, hematochezia. She denies any fever, chills or productive cough.   Past Medical History:  Diagnosis Date   Diabetes mellitus without complication ([place])    History of venomous spider bite 2015   Hospital inpatient tx for 1 week.   Hyperlipidemia    Migraines     Patient Active Problem List   Diagnosis Date Noted   Diabetes ([place]) 03/30/2015   Anemia 03/23/2015   Chest pain 07/28/2014    History reviewed. No pertinent surgical history.  History reviewed. No pertinent surgical history.  Current Outpatient Rx   Order #: [id number] Class: Print    Allergies:  Morphine and related  Family History: Family History  Problem Relation Age of Onset   Cancer Mother     Breast   Diabetes Father    Aneurysm Father     Brain    Social History: Social History  Substance Use Topics   Smoking status: Never Smoker   Smokeless tobacco: Never Used   Alcohol use No     Review of Systems:   10 point review of systems was performed and was otherwise negative:  Constitutional: No fever Eyes: No visual disturbances ENT: No sore throat, ear pain Cardiac: Chest pain  substernal without radiation to the arm or jaw region Respiratory: No shortness of breath, wheezing, or stridor Abdomen: No abdominal pain, no vomiting, No diarrhea Endocrine: No weight loss, No night sweats Extremities: No peripheral edema, cyanosis Skin: No rashes, easy bruising Neurologic: No focal weakness, trouble with speech or swollowing Urologic: No dysuria, Hematuria, or urinary frequency Patient states she was at work when the panic attack started about 6 AM  Physical Exam:  ED Triage Vitals [04/20/16 0759]  Enc Vitals Group     BP (!) 136/96     Pulse Rate 96     Resp 18     Temp 98.2 F (36.8 C)     Temp Source Oral     SpO2 98 %     Weight 190 lb (86.2 kg)     Height 5\' 4"  (1.626 m)     Head Circumference      Peak Flow      Pain Score      Pain Loc      Pain Edu?      Excl. in [place]?     General: Awake , Alert , and Oriented times 3; GCS 15 Patient's very anxious and crying and hyperventilating at times Head: Normal cephalic , atraumatic Eyes: Pupils equal , round, reactive to light Nose/Throat: No nasal drainage, patent upper airway without erythema or exudate.  Neck: Supple, Full range of motion, No anterior adenopathy or  palpable thyroid masses Lungs: Clear to ascultation without wheezes , rhonchi, or rales Heart: Regular rate, regular rhythm without murmurs , gallops , or rubs Abdomen: Soft, non tender without rebound, guarding , or rigidity; bowel sounds positive and symmetric in all 4 quadrants. No organomegaly .        Extremities: 2 plus symmetric pulses. No edema, clubbing or cyanosis Neurologic: normal ambulation, Motor symmetric without deficits, sensory intact Skin: warm, dry, no rashes   Labs:   All laboratory work was reviewed including any pertinent negatives or positives listed below:  Labs Reviewed  CBC WITH DIFFERENTIAL/PLATELET  COMPREHENSIVE METABOLIC PANEL  FIBRIN DERIVATIVES D-DIMER ([place])  TROPONIN I    EKG:  ED ECG  REPORT I, Daymon Larsen, the attending physician, personally viewed and interpreted this ECG.  Date: 04/20/2016 EKG Time: 0754 Rate: 93 Rhythm: normal sinus rhythm QRS Axis: Right axis deviation Intervals: normal ST/T Wave abnormalities: normal Conduction Disturbances: none Narrative Interpretation: unremarkable Low-voltage QRS with poor R-wave progression in the anterior leads No acute ischemic changes   Radiology: * "Dg Chest 2 View  Result Date: 04/20/2016 CLINICAL DATA:  Pt c/o center chest pain/SOB this AM, hx asthma, nonsmoker, pt shielded EXAM: CHEST - 2 VIEW COMPARISON:  07/30/2014 FINDINGS: Low lung volumes.  No focal infiltrate or overt edema. Heart size and mediastinal contours are within normal limits. No effusion. Visualized bones unremarkable. IMPRESSION: Low volumes.  No acute disease. Electronically Signed   By: Lucrezia Europe M.D.   On: 04/20/2016 09:43  "  I personally reviewed the radiologic studies    ED Course:  Differential includes all life-threatening causes for chest pain. This includes but is not exclusive to acute coronary syndrome, aortic dissection, pulmonary embolism, cardiac tamponade, community-acquired pneumonia, pericarditis, musculoskeletal chest wall pain, etc. Given the patient's current clinical presentation and objective findings I felt this was unlikely to be a life-threatening cause for chest pain and most likely is a panic attack. The patient received Ativan with symptomatic relief. Clinical Course     Assessment: * Anxiety syndrome Acute unspecified chest pain      Plan:  Outpatient " Discharge Medication List as of 04/20/2016 10:35 AM    START taking these medications   Details  LORazepam (ATIVAN) 1 MG tablet Take 1 tablet (1 mg total) by mouth 2 (two) times daily as needed for anxiety., Starting Fri 04/20/2016, Print      " Patient was advised to return immediately if condition worsens. Patient was advised to follow up with  their primary care physician or other specialized physicians involved in their outpatient care. The patient and/or family member/power of attorney had laboratory results reviewed at the bedside. All questions and concerns were addressed and appropriate discharge instructions were distributed by the nursing staff.             Daymon Larsen, MD 04/20/16 [phone]

## 2016-04-20 NOTE — ED Notes (Signed)
Patient transported to X-ray 

## 2016-06-14 ENCOUNTER — Emergency Department
Admission: EM | Admit: 2016-06-14 | Discharge: 2016-06-14 | Disposition: A | Discharge status: Home / Self Care | Payer: Self-pay | Attending: Emergency Medicine | Admitting: Emergency Medicine

## 2016-06-14 DIAGNOSIS — B9689 Other specified bacterial agents as the cause of diseases classified elsewhere: Secondary | ICD-10-CM

## 2016-06-14 DIAGNOSIS — N390 Urinary tract infection, site not specified: Secondary | ICD-10-CM

## 2016-06-14 DIAGNOSIS — N760 Acute vaginitis: Secondary | ICD-10-CM | POA: Insufficient documentation

## 2016-06-14 DIAGNOSIS — Z79899 Other long term (current) drug therapy: Secondary | ICD-10-CM | POA: Insufficient documentation

## 2016-06-14 DIAGNOSIS — E119 Type 2 diabetes mellitus without complications: Secondary | ICD-10-CM | POA: Insufficient documentation

## 2016-06-14 DIAGNOSIS — E1165 Type 2 diabetes mellitus with hyperglycemia: Secondary | ICD-10-CM

## 2016-06-14 DIAGNOSIS — E118 Type 2 diabetes mellitus with unspecified complications: Secondary | ICD-10-CM

## 2016-06-14 DIAGNOSIS — IMO0002 Reserved for concepts with insufficient information to code with codable children: Secondary | ICD-10-CM

## 2016-06-14 LAB — URINALYSIS COMPLETE WITH MICROSCOPIC (ARMC ONLY)
BACTERIA UA: NONE SEEN
Bilirubin Urine: NEGATIVE
Ketones, ur: NEGATIVE mg/dL
Nitrite: POSITIVE — AB
PROTEIN: 30 mg/dL — AB
SPECIFIC GRAVITY, URINE: 1.029 (ref 1.005–1.030)
pH: 6 (ref 5.0–8.0)

## 2016-06-14 LAB — CHLAMYDIA/NGC RT PCR (ARMC ONLY)
CHLAMYDIA TR: NOT DETECTED
N gonorrhoeae: NOT DETECTED

## 2016-06-14 LAB — WET PREP, GENITAL
SPERM: NONE SEEN
TRICH WET PREP: NONE SEEN
YEAST WET PREP: NONE SEEN

## 2016-06-14 LAB — POCT PREGNANCY, URINE: PREG TEST UR: NEGATIVE

## 2016-06-14 LAB — GLUCOSE, CAPILLARY: Glucose-Capillary: 392 mg/dL — ABNORMAL HIGH (ref 65–99)

## 2016-06-14 MED ORDER — METRONIDAZOLE 500 MG PO TABS: 500.0000 mg | ORAL_TABLET | Freq: Two times a day (BID) | ORAL | 0 refills | Status: DC

## 2016-06-14 MED ORDER — METFORMIN HCL 500 MG PO TABS
500.0000 mg | ORAL_TABLET | Freq: Once | ORAL | Status: AC
  Administered 2016-06-14: 500 mg via ORAL
  Filled 2016-06-14: qty 1

## 2016-06-14 MED ORDER — METFORMIN HCL 500 MG PO TABS: 500.0000 mg | ORAL_TABLET | Freq: Two times a day (BID) | ORAL | 1 refills | Status: DC

## 2016-06-14 MED ORDER — SULFAMETHOXAZOLE-TRIMETHOPRIM 800-160 MG PO TABS
1.0000 | ORAL_TABLET | Freq: Once | ORAL | Status: AC
  Administered 2016-06-14: 1 via ORAL
  Filled 2016-06-14: qty 1

## 2016-06-14 MED ORDER — SULFAMETHOXAZOLE-TRIMETHOPRIM 800-160 MG PO TABS: 1.0000 | ORAL_TABLET | Freq: Two times a day (BID) | ORAL | 0 refills | Status: DC

## 2016-06-14 MED ORDER — METRONIDAZOLE 500 MG PO TABS
500.0000 mg | ORAL_TABLET | Freq: Once | ORAL | Status: AC
  Administered 2016-06-14: 500 mg via ORAL
  Filled 2016-06-14: qty 1

## 2016-06-14 NOTE — ED Provider Notes (Signed)
[facility] Emergency Department Provider Note  ____________________________________________   First MD Initiated Contact with Patient 06/14/16 1600     (approximate)  I have reviewed the triage vital signs and the nursing notes.   HISTORY  Chief Complaint SEXUALLY TRANSMITTED DISEASE and Vaginal Discharge    HPI Wendy Frazier is a 45 y.o. female is here complaining of vaginal discharge since yesterday.  Patient denies fever or chills. Patient states that currently she is in a relationship and is fearful for STDs. Patient states that the only STD she has possibly had the past was herpes but the culture did not test positive. Patient states that she has had an odor with the discharge. She denies any itching. Currently she rates her pain as 10 over 10.   Past Medical History:  Diagnosis Date  . Diabetes mellitus without complication (H[facility])   . History of venomous spider bite 2015   Hospital inpatient tx for 1 week.  . Hyperlipidemia   . Migraines     Patient Active Problem List   Diagnosis Date Noted  . Diabetes (HCC) 03/30/2015  . Anemia 03/23/2015  . Chest pain 07/28/2014    No past surgical history on file.  Prior to Admission medications   Medication Sig Start Date End Date Taking? Authorizing Provider  LORazepam (ATIVAN) 1 MG tablet Take 1 tablet (1 mg total) by mouth 2 (two) times daily as needed for anxiety. 04/20/16   Jennye Moccasin, MD  metFORMIN (GLUCOPHAGE) 500 MG tablet Take 1 tablet (500 mg total) by mouth 2 (two) times daily with a meal. 06/14/16 06/14/17  Tommi Rumps, PA-C  metroNIDAZOLE (FLAGYL) 500 MG tablet Take 1 tablet (500 mg total) by mouth 2 (two) times daily. 06/14/16   Tommi Rumps, PA-C  sulfamethoxazole-trimethoprim (BACTRIM DS,SEPTRA DS) 800-160 MG tablet Take 1 tablet by mouth 2 (two) times daily. 06/14/16   Tommi Rumps, PA-C    Allergies Morphine and related  Family History  Problem Relation Age of  Onset  . Cancer Mother     Breast  . Diabetes Father   . Aneurysm Father     Brain    Social History Social History  Substance Use Topics  . Smoking status: Never Smoker  . Smokeless tobacco: Never Used  . Alcohol use No    Review of Systems C onstitutional: No fever/chills Eyes: No visual changes. ENT: No sore throat. Cardiovascular: Denies chest pain. Respiratory: Denies shortness of breath. Gastrointestinal: No abdominal pain.  No nausea, no vomiting.   Genitourinary: Positive for dysuria. Positive for vaginal discharge. Musculoskeletal: Negative for back pain. Skin: Negative for rash. Neurological: Negative for headaches, focal weakness or numbness.  10-point ROS otherwise negative.  ____________________________________________   PHYSICAL EXAM:  VITAL SIGNS: ED Triage Vitals  Enc Vitals Group     BP 06/14/16 1547 122/67     Pulse Rate 06/14/16 1547 98     Resp 06/14/16 1547 18     Temp 06/14/16 1547 98.8 F (37.1 C)     Temp Source 06/14/16 1547 Oral     SpO2 06/14/16 1547 97 %     Weight 06/14/16 1548 193 lb (87.5 kg)     Height 06/14/16 1548 5\' 4"  (1.626 m)     Head Circumference --      Peak Flow --      Pain Score 06/14/16 1549 10     Pain Loc --      Pain Edu? --  Excl. in [place]? --     Constitutional: Alert and oriented. Well appearing and in no acute distress. Eyes: Conjunctivae are normal. PERRL. EOMI. Head: Atraumatic. Nose: No congestion/rhinnorhea. Neck: No stridor.   Cardiovascular: Normal rate, regular rhythm. Grossly normal heart sounds.  Good peripheral circulation. Respiratory: Normal respiratory effort.  No retractions. Lungs CTAB. Gastrointestinal: Soft and nontender. No distention. No CVA tenderness. Genitourinary: No lesions are noted external genitalia. There is moderate amount of vaginal discharge that is thin and white.  Moderate odor noted.  No adnexal masses or tenderness is noted. There is negative chandelier  sign. Musculoskeletal: His upper and lower extremities without any difficulty. Normal gait was noted. Neurologic:  Normal speech and language. No gross focal neurologic deficits are appreciated. No gait instability. Skin:  Skin is warm, dry and intact. No rash noted. Psychiatric: Mood and affect are normal. Speech and behavior are normal.  ____________________________________________   LABS (all labs ordered are listed, but only abnormal results are displayed)  Labs Reviewed  WET PREP, GENITAL - Abnormal; Notable for the following:       Result Value   Clue Cells Wet Prep HPF POC PRESENT (*)    WBC, Wet Prep HPF POC MODERATE (*)    All other components within normal limits  URINALYSIS COMPLETEWITH MICROSCOPIC ([facility] ONLY) - Abnormal; Notable for the following:    Color, Urine YELLOW (*)    APPearance CLOUDY (*)    Glucose, UA >500 (*)    Hgb urine dipstick 1+ (*)    Protein, ur 30 (*)    Nitrite POSITIVE (*)    Leukocytes, UA 3+ (*)    Squamous Epithelial / LPF 0-5 (*)    All other components within normal limits  GLUCOSE, CAPILLARY - Abnormal; Notable for the following:    Glucose-Capillary 392 (*)    All other components within normal limits  CHLAMYDIA/NGC RT PCR ([facility] ONLY)  POC URINE PREG, ED  POCT PREGNANCY, URINE  CBG MONITORING, ED   _ PROCEDURES  Procedure(s) performed: None  Procedures  Critical Care performed: No  ____________________________________________   INITIAL IMPRESSION / ASSESSMENT AND PLAN / ED COURSE  Pertinent labs & imaging results that were available during my care of the patient were reviewed by me and considered in my medical decision making (see chart for details).    Clinical Course    Glucose in the urine was over 500. Glucose fingerstick nonfasting was 392. Patient states that she has not taken any metformin for at least 2 months and maybe longer. Patient states she was going to [facility]  but quit going. She has  not seen anyone since that time.  Patient was given a prescription for metformin 500 mg twice a day to begin taking today. She is also told that she needs to have continued care for control of her diabetes and encouraged to make an appointment with the [facility]. Patient does not check blood sugars at all. She is encouraged to drink plenty of fluids without sugar. Patient was given Flagyl, metformin, Bactrim DS while in the emergency room and a prescription for the same to begin taking tomorrow with the pharmacies are open. Patient was given information about the [facility] as far as contact information to make an appointment.  ____________________________________________   FINAL CLINICAL IMPRESSION(S) / ED DIAGNOSES  Final diagnoses:  BV (bacterial vaginosis)  Acute urinary tract infection  Uncontrolled type 2 diabetes mellitus with complication, without long-term current use of  insulin (HCC)      NEW MEDICATIONS STARTED DURING THIS VISIT:  Discharge Medication List as of 06/14/2016  5:40 PM    START taking these medications   Details  metroNIDAZOLE (FLAGYL) 500 MG tablet Take 1 tablet (500 mg total) by mouth 2 (two) times daily., Starting Thu 06/14/2016, Print    sulfamethoxazole-trimethoprim (BACTRIM DS,SEPTRA DS) 800-160 MG tablet Take 1 tablet by mouth 2 (two) times daily., Starting Thu 06/14/2016, Print         Note:  This document was prepared using Dragon voice recognition software and may include unintentional dictation errors.    Tommi Rumps, PA-C 06/14/16 1812    Minna Antis, MD 06/14/16 [phone]

## 2016-06-14 NOTE — ED Triage Notes (Signed)
Here for concern of STD.  States that she has vaginal discharge that is foul smelling. Started yesterday.
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
## 2016-06-14 NOTE — Discharge Instructions (Signed)
[facility]. Call to make an appointment to continue with your metformin and controlling your diabetes. Begin taking Flagyl as directed until completely finished. Been given a prescription for metformin until [facility]. Bactrim DS twice a day for 10 days for your urinary tract infection. This medication is on the $4 list at [facility]. Increase fluids.

## 2016-08-05 ENCOUNTER — Emergency Department
Admission: EM | Admit: 2016-08-05 | Discharge: 2016-08-05 | Disposition: A | Discharge status: Home / Self Care | Payer: Self-pay | Attending: Emergency Medicine | Admitting: Emergency Medicine

## 2016-08-05 ENCOUNTER — Encounter: Payer: Self-pay | Admitting: *Deleted

## 2016-08-05 DIAGNOSIS — E119 Type 2 diabetes mellitus without complications: Secondary | ICD-10-CM | POA: Insufficient documentation

## 2016-08-05 DIAGNOSIS — Z7984 Long term (current) use of oral hypoglycemic drugs: Secondary | ICD-10-CM | POA: Insufficient documentation

## 2016-08-05 DIAGNOSIS — Z79899 Other long term (current) drug therapy: Secondary | ICD-10-CM | POA: Insufficient documentation

## 2016-08-05 DIAGNOSIS — R197 Diarrhea, unspecified: Secondary | ICD-10-CM

## 2016-08-05 LAB — COMPREHENSIVE METABOLIC PANEL
ALBUMIN: 3.5 g/dL (ref 3.5–5.0)
ALK PHOS: 73 U/L (ref 38–126)
ALT: 17 U/L (ref 14–54)
ANION GAP: 9 (ref 5–15)
AST: 28 U/L (ref 15–41)
BILIRUBIN TOTAL: 0.5 mg/dL (ref 0.3–1.2)
BUN: 15 mg/dL (ref 6–20)
CALCIUM: 8.2 mg/dL — AB (ref 8.9–10.3)
CO2: 22 mmol/L (ref 22–32)
CREATININE: 1.08 mg/dL — AB (ref 0.44–1.00)
Chloride: 100 mmol/L — ABNORMAL LOW (ref 101–111)
GFR calc Af Amer: >60 mL/min (ref >60)
GFR calc non Af Amer: >60 mL/min (ref >60)
GLUCOSE: 355 mg/dL — AB (ref 65–99)
Potassium: 3.9 mmol/L (ref 3.5–5.1)
Sodium: 131 mmol/L — ABNORMAL LOW (ref 135–145)
TOTAL PROTEIN: 7.3 g/dL (ref 6.5–8.1)

## 2016-08-05 LAB — URINALYSIS, COMPLETE (UACMP) WITH MICROSCOPIC
Bilirubin Urine: NEGATIVE
Ketones, ur: NEGATIVE mg/dL
NITRITE: NEGATIVE
PH: 5 (ref 5.0–8.0)
Protein, ur: 100 mg/dL — AB
SPECIFIC GRAVITY, URINE: 1.037 — AB (ref 1.005–1.030)

## 2016-08-05 LAB — CBC
HCT: 37.2 % (ref 35.0–47.0)
HEMOGLOBIN: 12.7 g/dL (ref 12.0–16.0)
MCH: 26.9 pg (ref 26.0–34.0)
MCHC: 34.2 g/dL (ref 32.0–36.0)
MCV: 78.8 fL — ABNORMAL LOW (ref 80.0–100.0)
PLATELETS: 284 10*3/uL (ref 150–440)
RBC: 4.72 MIL/uL (ref 3.80–5.20)
RDW: 14.9 % — AB (ref 11.5–14.5)
WBC: 6.3 10*3/uL (ref 3.6–11.0)

## 2016-08-05 LAB — LIPASE, BLOOD: Lipase: 19 U/L (ref 11–51)

## 2016-08-05 LAB — PREGNANCY, URINE: Preg Test, Ur: NEGATIVE

## 2016-08-05 MED ORDER — LOPERAMIDE HCL 2 MG PO TABS
2.0000 mg | ORAL_TABLET | Freq: Four times a day (QID) | ORAL | 0 refills | Status: DC | PRN
Start: 2016-08-05 — End: 2017-11-12

## 2016-08-05 NOTE — ED Notes (Signed)
PT given a sample collection cup and verbalized a urine sample was needed.

## 2016-08-05 NOTE — ED Triage Notes (Addendum)
PT arrived to ED reporting onset of diarrhea and nausea at 5:00 this morning. Pt reports having had a BM every 15 minutes since onset. No fever at this time. Pt alert and oriented and denies vomiting. PT deneis currently or recently taking antibiotics.

## 2016-08-05 NOTE — ED Notes (Signed)
Patient c/o diarrhea q15M since 0500 till arrival at ED. No diarrhea at current however patient is complaining of frequent urination and nausea

## 2016-08-05 NOTE — ED Provider Notes (Signed)
[facility] Emergency Department Provider Note  ____________________________________________   First MD Initiated Contact with Patient 08/05/16 1402     (approximate)  I have reviewed the triage vital signs and the nursing notes.   HISTORY  Chief Complaint Diarrhea   HPI Wendy Frazier is a 46 y.o. female with a history of diabetes as well as hyperlipidemia and migraines was presenting to the emergency department with diarrhea. Says the diarrhea started at 5 AM this morning and was every 15 minutes up until about 10:30 AM. She says that she sobers up about 4 times during this period. She says that the diarrhea has now stopped. Denies any blood in her urine. Some mild pain over the left flank. No recent MI.   Past Medical History:  Diagnosis Date   Diabetes mellitus without complication ([place])    History of venomous spider bite 2015   Hospital inpatient tx for 1 week.   Hyperlipidemia    Migraines     Patient Active Problem List   Diagnosis Date Noted   Diabetes ([place]) 03/30/2015   Anemia 03/23/2015   Chest pain 07/28/2014    History reviewed. No pertinent surgical history.  Prior to Admission medications   Medication Sig Start Date End Date Taking? Authorizing Provider  LORazepam (ATIVAN) 1 MG tablet Take 1 tablet (1 mg total) by mouth 2 (two) times daily as needed for anxiety. 04/20/16   Daymon Larsen, MD  metFORMIN (GLUCOPHAGE) 500 MG tablet Take 1 tablet (500 mg total) by mouth 2 (two) times daily with a meal. 06/14/16 06/14/17  Johnn Hai, PA-C  metroNIDAZOLE (FLAGYL) 500 MG tablet Take 1 tablet (500 mg total) by mouth 2 (two) times daily. 06/14/16   Johnn Hai, PA-C  sulfamethoxazole-trimethoprim (BACTRIM DS,SEPTRA DS) 800-160 MG tablet Take 1 tablet by mouth 2 (two) times daily. 06/14/16   Johnn Hai, PA-C    Allergies Morphine and related  Family History  Problem Relation Age of Onset   Cancer Mother      Breast   Diabetes Father    Aneurysm Father     Brain    Social History Social History  Substance Use Topics   Smoking status: Never Smoker   Smokeless tobacco: Never Used   Alcohol use No    Review of Systems Constitutional: No fever/chills Eyes: No visual changes. ENT: No sore throat. Cardiovascular: Denies chest pain. Respiratory: Denies shortness of breath. Gastrointestinal: No abdominal pain.  No nausea, no vomiting.    No constipation. Genitourinary: Negative for dysuria. Musculoskeletal: Negative for back pain. Skin: Negative for rash. Neurological: Negative for headaches, focal weakness or numbness.  10-point ROS otherwise negative.  ____________________________________________   PHYSICAL EXAM:  VITAL SIGNS: ED Triage Vitals  Enc Vitals Group     BP 08/05/16 1158 137/77     Pulse --      Resp 08/05/16 1158 16     Temp 08/05/16 1158 98.3 F (36.8 C)     Temp Source 08/05/16 1158 Oral     SpO2 08/05/16 1158 99 %     Weight 08/05/16 1159 190 lb (86.2 kg)     Height 08/05/16 1159 5\' 4"  (1.626 m)     Head Circumference --      Peak Flow --      Pain Score 08/05/16 1200 5     Pain Loc --      Pain Edu? --      Excl. in [place]? --  Constitutional: Alert and oriented. Well appearing and in no acute distress. Eyes: Conjunctivae are normal. PERRL. EOMI. Head: Atraumatic. Nose: No congestion/rhinnorhea. Mouth/Throat: Mucous membranes are moist.  Neck: No stridor.   Cardiovascular: Normal rate, regular rhythm. Grossly normal heart sounds.   Respiratory: Normal respiratory effort.  No retractions. Lungs CTAB. Gastrointestinal: Soft and nontender. No distention. No CVA tenderness. Musculoskeletal: No lower extremity tenderness nor edema.  No joint effusions. Neurologic:  Normal speech and language. No gross focal neurologic deficits are appreciated. No gait instability. Skin:  Skin is warm, dry and intact. No rash noted. Psychiatric: Mood and affect are  normal. Speech and behavior are normal.  ____________________________________________   LABS (all labs ordered are listed, but only abnormal results are displayed)  Labs Reviewed  COMPREHENSIVE METABOLIC PANEL - Abnormal; Notable for the following:       Result Value   Sodium 131 (*)    Chloride 100 (*)    Glucose, Bld 355 (*)    Creatinine, Ser 1.08 (*)    Calcium 8.2 (*)    All other components within normal limits  CBC - Abnormal; Notable for the following:    MCV 78.8 (*)    RDW 14.9 (*)    All other components within normal limits  URINALYSIS, COMPLETE (UACMP) WITH MICROSCOPIC - Abnormal; Notable for the following:    Color, Urine YELLOW (*)    APPearance CLEAR (*)    Specific Gravity, Urine 1.037 (*)    Glucose, UA >=500 (*)    Hgb urine dipstick SMALL (*)    Protein, ur 100 (*)    Leukocytes, UA SMALL (*)    Bacteria, UA RARE (*)    Squamous Epithelial / LPF 6-30 (*)    All other components within normal limits  URINE CULTURE  LIPASE, BLOOD  PREGNANCY, URINE   ____________________________________________  EKG   ____________________________________________  RADIOLOGY   ____________________________________________   PROCEDURES  Procedure(s) performed:   Procedures  Critical Care performed:   ____________________________________________   INITIAL IMPRESSION / ASSESSMENT AND PLAN / ED COURSE  Pertinent labs & imaging results that were available during my care of the patient were reviewed by me and considered in my medical decision making (see chart for details).   Clinical Course    ----------------------------------------- 3:14 PM on 08/05/2016 -----------------------------------------  Patient without any distress. Reassuring vital signs. Elevated glucose but says she did not take her metformin yesterday and has not taken it yet today either. I advised her that she must continue to take this medication as prescribed and that her sugars do  not continue to be out of control. Unclear cause of the diarrhea but it appears to have stopped as the patient has not had any episodes over about the past 4 hours. I will discharge her with Imodium to be used as needed. She knows that she must not return to work until her diarrhea has stopped. She works in [employer]. I'm able to write her only for 2 days out of work. She says that she also is off on Tuesday and Wednesday of this week. She says that [facility]. Likely viral versus possible food poisoning. Abdomen is benign. Doubt intra-abdominal process such as colitis. Normal blood cell count. Also without high risk features for C. difficile. White blood cell count is normal and the symptoms have now stopped.  ____________________________________________   FINAL CLINICAL IMPRESSION(S) / ED DIAGNOSES  Diarrhea.    NEW MEDICATIONS STARTED DURING THIS VISIT:  New Prescriptions   No medications on file     Note:  This document was prepared using Dragon voice recognition software and may include unintentional dictation errors.    Orbie Pyo, MD 08/05/16 1515

## 2016-08-07 LAB — URINE CULTURE

## 2017-02-03 ENCOUNTER — Emergency Department: Payer: Self-pay

## 2017-02-03 ENCOUNTER — Encounter: Payer: Self-pay | Admitting: Emergency Medicine

## 2017-02-03 ENCOUNTER — Emergency Department
Admission: EM | Admit: 2017-02-03 | Discharge: 2017-02-03 | Disposition: A | Discharge status: Home / Self Care | Payer: Self-pay | Attending: Emergency Medicine | Admitting: Emergency Medicine

## 2017-02-03 DIAGNOSIS — R1013 Epigastric pain: Secondary | ICD-10-CM

## 2017-02-03 DIAGNOSIS — Z7984 Long term (current) use of oral hypoglycemic drugs: Secondary | ICD-10-CM | POA: Insufficient documentation

## 2017-02-03 DIAGNOSIS — Z79899 Other long term (current) drug therapy: Secondary | ICD-10-CM | POA: Insufficient documentation

## 2017-02-03 DIAGNOSIS — R109 Unspecified abdominal pain: Secondary | ICD-10-CM

## 2017-02-03 DIAGNOSIS — R1110 Vomiting, unspecified: Secondary | ICD-10-CM | POA: Insufficient documentation

## 2017-02-03 DIAGNOSIS — E119 Type 2 diabetes mellitus without complications: Secondary | ICD-10-CM | POA: Insufficient documentation

## 2017-02-03 HISTORY — DX: Unspecified asthma, uncomplicated: J45.909

## 2017-02-03 LAB — LIPASE, BLOOD: LIPASE: 36 U/L (ref 11–51)

## 2017-02-03 LAB — URINALYSIS, COMPLETE (UACMP) WITH MICROSCOPIC
Bilirubin Urine: NEGATIVE
Hgb urine dipstick: NEGATIVE
KETONES UR: NEGATIVE mg/dL
Nitrite: NEGATIVE
PROTEIN: 30 mg/dL — AB
Specific Gravity, Urine: 1.031 — ABNORMAL HIGH (ref 1.005–1.030)
pH: 6 (ref 5.0–8.0)

## 2017-02-03 LAB — COMPREHENSIVE METABOLIC PANEL
ALT: 11 U/L — ABNORMAL LOW (ref 14–54)
ANION GAP: 8 (ref 5–15)
AST: 16 U/L (ref 15–41)
Albumin: 3.3 g/dL — ABNORMAL LOW (ref 3.5–5.0)
Alkaline Phosphatase: 72 U/L (ref 38–126)
BUN: 16 mg/dL (ref 6–20)
CHLORIDE: 103 mmol/L (ref 101–111)
CO2: 22 mmol/L (ref 22–32)
Calcium: 9 mg/dL (ref 8.9–10.3)
Creatinine, Ser: 0.97 mg/dL (ref 0.44–1.00)
Glucose, Bld: 331 mg/dL — ABNORMAL HIGH (ref 65–99)
POTASSIUM: 4.2 mmol/L (ref 3.5–5.1)
SODIUM: 133 mmol/L — AB (ref 135–145)
Total Bilirubin: 0.5 mg/dL (ref 0.3–1.2)
Total Protein: 7.2 g/dL (ref 6.5–8.1)

## 2017-02-03 LAB — CBC
HCT: 37.5 % (ref 35.0–47.0)
HEMOGLOBIN: 12.8 g/dL (ref 12.0–16.0)
MCH: 27.2 pg (ref 26.0–34.0)
MCHC: 34.2 g/dL (ref 32.0–36.0)
MCV: 79.7 fL — AB (ref 80.0–100.0)
Platelets: 315 10*3/uL (ref 150–440)
RBC: 4.71 MIL/uL (ref 3.80–5.20)
RDW: 13.6 % (ref 11.5–14.5)
WBC: 8 10*3/uL (ref 3.6–11.0)

## 2017-02-03 LAB — TROPONIN I

## 2017-02-03 LAB — POCT PREGNANCY, URINE: Preg Test, Ur: NEGATIVE

## 2017-02-03 MED ORDER — IOPAMIDOL (ISOVUE-300) INJECTION 61%
30.0000 mL | Freq: Once | INTRAVENOUS | Status: AC | PRN
  Administered 2017-02-03: 30 mL via ORAL

## 2017-02-03 MED ORDER — SODIUM CHLORIDE 0.9 % IV BOLUS (SEPSIS)
1000.0000 mL | Freq: Once | INTRAVENOUS | Status: AC
  Administered 2017-02-03: 1000 mL via INTRAVENOUS

## 2017-02-03 MED ORDER — HYDROMORPHONE HCL 1 MG/ML IJ SOLN
0.5000 mg | INTRAMUSCULAR | Status: AC
  Administered 2017-02-03: 0.5 mg via INTRAVENOUS
  Filled 2017-02-03: qty 1

## 2017-02-03 MED ORDER — ONDANSETRON HCL 4 MG/2ML IJ SOLN
4.0000 mg | Freq: Once | INTRAMUSCULAR | Status: AC
  Administered 2017-02-03: 4 mg via INTRAVENOUS
  Filled 2017-02-03: qty 2

## 2017-02-03 MED ORDER — ONDANSETRON 4 MG PO TBDP: 4.0000 mg | ORAL_TABLET | Freq: Four times a day (QID) | ORAL | 0 refills | Status: DC | PRN

## 2017-02-03 MED ORDER — IOPAMIDOL (ISOVUE-300) INJECTION 61%
100.0000 mL | Freq: Once | INTRAVENOUS | Status: AC | PRN
  Administered 2017-02-03: 100 mL via INTRAVENOUS

## 2017-02-03 NOTE — ED Notes (Signed)
Pt resting quietly, no distress noted, pt states that her mom doesn't feel good and isn't going to be able to come get her, "but I feel fine" pt reminded of our discussion prior to the administration of the pain medication and that legally she can not drive for quite awhile after receiving the medication, pt currently calling other people for a ride

## 2017-02-03 NOTE — ED Notes (Signed)
Pt cont to drink her contrast, pain med administered as ordered, pt states her mom is coming after she gets off work and will be able to drive her home

## 2017-02-03 NOTE — ED Provider Notes (Signed)
[facility] Emergency Department Provider Note   ____________________________________________   First MD Initiated Contact with Patient 02/03/17 1401     (approximate)  I have reviewed the triage vital signs and the nursing notes.   HISTORY  Chief Complaint Abdominal Pain   HPI Wendy Frazier is a 46 y.o. female who presents for evaluation of sudden abdominal pain and vomiting  Patient reports just a couple hours ago she was at work when she began experiencing rather abrupt onset of severe pain across the entire upper abdomen and radiates towards the back. No chest pain no trouble breathing. She does report mild nausea and vomited a couple of times she describes as water.  No diarrhea. No pain in the lower abdomen. No vaginal bleeding or discharge.  She also reports she slight itchy rash along the skin folds of her lower abdomenwhich is been present for a month or 2 and causes a itching discomfort at times.  Presently for severe pain in the upper abdomen. Discussed with the patient, she is agreeable to waiting for pain medication until after ultrasound.  Past Medical History:  Diagnosis Date   Diabetes mellitus without complication ([place])    History of venomous spider bite 2015   Hospital inpatient tx for 1 week.   Hyperlipidemia    Migraines     Patient Active Problem List   Diagnosis Date Noted   Diabetes ([place]) 03/30/2015   Anemia 03/23/2015   Chest pain 07/28/2014    History reviewed. No pertinent surgical history.  Prior to Admission medications   Medication Sig Start Date End Date Taking? Authorizing Provider  loperamide (IMODIUM A-D) 2 MG tablet Take 1 tablet (2 mg total) by mouth 4 (four) times daily as needed for diarrhea or loose stools. 08/05/16   Schaevitz, Randall An, MD  LORazepam (ATIVAN) 1 MG tablet Take 1 tablet (1 mg total) by mouth 2 (two) times daily as needed for anxiety. 04/20/16   Daymon Larsen, MD  metFORMIN  (GLUCOPHAGE) 500 MG tablet Take 1 tablet (500 mg total) by mouth 2 (two) times daily with a meal. 06/14/16 06/14/17  Letitia Neri L, PA-C  metroNIDAZOLE (FLAGYL) 500 MG tablet Take 1 tablet (500 mg total) by mouth 2 (two) times daily. 06/14/16   Johnn Hai, PA-C  ondansetron (ZOFRAN ODT) 4 MG disintegrating tablet Take 1 tablet (4 mg total) by mouth every 6 (six) hours as needed for nausea or vomiting. 02/03/17   Delman Kitten, MD  sulfamethoxazole-trimethoprim (BACTRIM DS,SEPTRA DS) 800-160 MG tablet Take 1 tablet by mouth 2 (two) times daily. 06/14/16   Johnn Hai, PA-C    Allergies Morphine and related  Family History  Problem Relation Age of Onset   Cancer Mother        Breast   Diabetes Father    Aneurysm Father        Brain    Social History Social History  Substance Use Topics   Smoking status: Never Smoker   Smokeless tobacco: Never Used   Alcohol use No    Review of Systems Constitutional: No fever/chills Eyes: No visual changes. ENT: No sore throat. Cardiovascular: Denies chest pain. Respiratory: Denies shortness of breath. Gastrointestinal:   No constipation. Genitourinary: Negative for dysuria. Musculoskeletal: Negative for back pain.Some of the pain does radiate from her abdomen towards her back. Skin: Negative for rash. Neurological: Negative for headaches, focal weakness or numbness.    ____________________________________________   PHYSICAL EXAM:  VITAL SIGNS: ED  Triage Vitals  Enc Vitals Group     BP 02/03/17 1329 (!) 157/95     Pulse Rate 02/03/17 1329 (!) 104     Resp 02/03/17 1329 20     Temp 02/03/17 1329 99.5 F (37.5 C)     Temp Source 02/03/17 1329 Oral     SpO2 02/03/17 1329 98 %     Weight 02/03/17 1330 190 lb (86.2 kg)     Height 02/03/17 1330 5\' 4"  (1.626 m)     Head Circumference --      Peak Flow --      Pain Score --      Pain Loc --      Pain Edu? --      Excl. in [place]? --     Constitutional: Alert and  oriented. No distress but does appear in pain Eyes: Conjunctivae are normal. Head: Atraumatic. Nose: No congestion/rhinnorhea. Mouth/Throat: Mucous membranes are moist. Neck: No stridor.   Cardiovascular: Normal rate, regular rhythm. Grossly normal heart sounds.  Good peripheral circulation. Respiratory: Normal respiratory effort.  No retractions. Lungs CTAB. Gastrointestinal: Soft and very tender across the epigastrium and right upper quadrant. A positive Percell Miller is elicited at the bedside. No pain across the lower quadrants. No focal pain at McBurney's point. Musculoskeletal: No lower extremity tenderness nor edema. Neurologic:  Normal speech and language. No gross focal neurologic deficits are appreciated.  Skin:  Skin is warm, dry and intact. No rash noted. Psychiatric: Mood and affect are normal. Speech and behavior are normal.  ____________________________________________   LABS (all labs ordered are listed, but only abnormal results are displayed)  Labs Reviewed  COMPREHENSIVE METABOLIC PANEL - Abnormal; Notable for the following:       Result Value   Sodium 133 (*)    Glucose, Bld 331 (*)    Albumin 3.3 (*)    ALT 11 (*)    All other components within normal limits  CBC - Abnormal; Notable for the following:    MCV 79.7 (*)    All other components within normal limits  URINALYSIS, COMPLETE (UACMP) WITH MICROSCOPIC - Abnormal; Notable for the following:    Color, Urine YELLOW (*)    APPearance CLEAR (*)    Specific Gravity, Urine 1.031 (*)    Glucose, UA >=500 (*)    Protein, ur 30 (*)    Leukocytes, UA SMALL (*)    Bacteria, UA RARE (*)    Squamous Epithelial / LPF 0-5 (*)    All other components within normal limits  LIPASE, BLOOD  TROPONIN I  POC URINE PREG, ED  POCT PREGNANCY, URINE   ____________________________________________  EKG  ED ECG REPORT I, Gordie Crumby, the attending physician, personally viewed and interpreted this ECG.  Date: 02/03/2017 EKG  Time: 1430 Rate: 85 Rhythm: normal sinus rhythm QRS Axis: normal Intervals: normal ST/T Wave abnormalities: normal Narrative Interpretation: unremarkable for acute abnormality  ____________________________________________  RADIOLOGY  Dg Abd 2 Views  Result Date: 02/03/2017 CLINICAL DATA:  Nausea, vomiting, upper abdominal pain EXAM: ABDOMEN - 2 VIEW COMPARISON:  08/19/2015 FINDINGS: The bowel gas pattern is normal. There is no evidence of free air. No radio-opaque calculi or other significant radiographic abnormality is seen. IMPRESSION: Negative. Electronically Signed   By: Rolm Baptise M.D.   On: 02/03/2017 14:57   US Abdomen Limited Ruq  Result Date: 02/03/2017 CLINICAL DATA:  Abdominal pain and vomiting. EXAM: ULTRASOUND ABDOMEN LIMITED RIGHT UPPER QUADRANT COMPARISON:  Abdomen CT dated 03/17/2010. FINDINGS:  Gallbladder: Poorly distended. No visible gallstones or wall thickening. No sonographic Murphy sign. Common bile duct: Diameter: 3.9 mm Liver: Mildly echogenic.  No corresponding low density on the previous CT. IMPRESSION: 1. Poorly distended gallbladder with no visible abnormality. 2. Mildly echogenic liver. This is most likely due to mild interval steatosis. Electronically Signed   By: Claudie Revering M.D.   On: 02/03/2017 15:16    ____________________________________________   PROCEDURES  Procedure(s) performed: None  Procedures  Critical Care performed: No  ____________________________________________   INITIAL IMPRESSION / ASSESSMENT AND PLAN / ED COURSE  Pertinent labs & imaging results that were available during my care of the patient were reviewed by me and considered in my medical decision making (see chart for details).  Differential diagnosis includes but is not limited to, abdominal perforation, aortic dissection, cholecystitis, appendicitis, diverticulitis, colitis, esophagitis/gastritis, kidney stone, pyelonephritis, urinary tract infection, aortic aneurysm.  All are considered in decision and treatment plan. Based upon the patient's presentation and risk factors,  I suspect epigastric or upper abdominal pathology including possibly acute cholecystitis or pancreatitis based on examination. Primarily appears to be an upper abdominal process reading the back. EKG normal. Denies any cardiac or respiratory symptoms. Does have very low-grade temp. We'll proceed initially with OF THE RIGHT UPPER QUADRANT TO FURTHER EVALUATE FOR ETIOLOGY.  ----------------------------------------- 4:00 PM on 02/03/2017 -----------------------------------------  The patient reports her pain is now seated primarily in the right mid and right upper abdomen radiating towards her right upper back. I ordered CT abdomen and pelvis as well as pain medication to further evaluate for pathology  Ongoing care with plan to follow up on CT and reevaluate signed out to Dr. Quentin Cornwall       ____________________________________________   FINAL CLINICAL IMPRESSION(S) / ED DIAGNOSES  Final diagnoses:  Abdominal pain  Epigastric abdominal pain      NEW MEDICATIONS STARTED DURING THIS VISIT:  New Prescriptions   ONDANSETRON (ZOFRAN ODT) 4 MG DISINTEGRATING TABLET    Take 1 tablet (4 mg total) by mouth every 6 (six) hours as needed for nausea or vomiting.     Note:  This document was prepared using Dragon voice recognition software and may include unintentional dictation errors.     Delman Kitten, MD 02/03/17 [phone]

## 2017-02-03 NOTE — ED Notes (Signed)
Pt assisted to the bathroom without diff, pt's sheet wet from urine, pt states that she has a problem with her bladder and sometimes she leaks, bed linens changed, pt states that she cont to have the abd pain but now is also having rt flank pain, pt reports that she is here by herself and drove herself, call bell within reach, remote given for comfort

## 2017-02-03 NOTE — ED Notes (Addendum)
FIRST NURSE NOTE: Pt states after she ate she vomited and now "it feels like my insides are being squeezed and while i'm here I want someone to look at a rash" Pt states the pain comes and goes. Pt offered wheelchair, but declined.

## 2017-02-03 NOTE — ED Provider Notes (Signed)
Patient received in sign-out from Dr. Jacqualine Code.  Workup and evaluation pending CT  Imaging and reassessment. CT imaging without any acute abnormality. She is hemodynamic stable with normal white count and a benign abdominal exam. At this point is for the patient stable for follow-up. We'll give referral to PCP. Discussed strict return precautions.  Patient was able to tolerate PO and was able to ambulate with a steady gait.  Have discussed with the patient and available family all diagnostics and treatments performed thus far and all questions were answered to the best of my ability. The patient demonstrates understanding and agreement with plan.       Merlyn Lot, MD 02/03/17 [id number]

## 2017-02-03 NOTE — ED Triage Notes (Signed)
Pt reports she ate lunch today then started vomiting. Ambulatory to triage. No vomiting in triage. No diarrhea. Pt c/o pain that goes across whole stomach. Points to area just below umbilical area.  Also c/o rash to groin and under stomach that has been present X 1 month she would like looked at.

## 2017-02-03 NOTE — Discharge Instructions (Signed)

## 2017-07-24 ENCOUNTER — Emergency Department
Admission: EM | Admit: 2017-07-24 | Discharge: 2017-07-24 | Disposition: A | Discharge status: Home / Self Care | Payer: Self-pay | Attending: Emergency Medicine | Admitting: Emergency Medicine

## 2017-07-24 ENCOUNTER — Encounter: Payer: Self-pay | Admitting: Emergency Medicine

## 2017-07-24 ENCOUNTER — Emergency Department: Payer: Self-pay

## 2017-07-24 DIAGNOSIS — Z7984 Long term (current) use of oral hypoglycemic drugs: Secondary | ICD-10-CM | POA: Insufficient documentation

## 2017-07-24 DIAGNOSIS — E119 Type 2 diabetes mellitus without complications: Secondary | ICD-10-CM | POA: Insufficient documentation

## 2017-07-24 DIAGNOSIS — J40 Bronchitis, not specified as acute or chronic: Secondary | ICD-10-CM | POA: Insufficient documentation

## 2017-07-24 MED ORDER — PREDNISONE 10 MG PO TABS: ORAL_TABLET | ORAL | 0 refills | Status: DC

## 2017-07-24 MED ORDER — IPRATROPIUM-ALBUTEROL 0.5-2.5 (3) MG/3ML IN SOLN
3.0000 mL | Freq: Once | RESPIRATORY_TRACT | Status: AC
  Administered 2017-07-24: 3 mL via RESPIRATORY_TRACT
  Filled 2017-07-24: qty 3

## 2017-07-24 MED ORDER — AZITHROMYCIN 250 MG PO TABS: ORAL_TABLET | ORAL | 0 refills | Status: DC

## 2017-07-24 MED ORDER — ALBUTEROL SULFATE HFA 108 (90 BASE) MCG/ACT IN AERS: 2.0000 | INHALATION_SPRAY | Freq: Four times a day (QID) | RESPIRATORY_TRACT | 0 refills | Status: DC | PRN

## 2017-07-24 MED ORDER — BENZONATATE 100 MG PO CAPS: 100.0000 mg | ORAL_CAPSULE | Freq: Three times a day (TID) | ORAL | 0 refills | Status: DC | PRN

## 2017-07-24 NOTE — ED Provider Notes (Signed)
[facility] Emergency Department Provider Note  ____________________________________________  Time seen: Approximately 9:15 PM  I have reviewed the triage vital signs and the nursing notes.   HISTORY  Chief Complaint URI    HPI Wendy Frazier is a 47 y.o. female that presents to the emergency department for evaluation of cough and chest congestion for 3 weeks.  Patient does not smoke.  She had asthma when she was young.  No allergies.  No fever, shortness of breath, chest pain, nausea, vomiting, abdominal pain.   Past Medical History:  Diagnosis Date   Asthma    Diabetes mellitus without complication ([place])    History of venomous spider bite 2015   Hospital inpatient tx for 1 week.   Hyperlipidemia    Migraines     Patient Active Problem List   Diagnosis Date Noted   Diabetes ([place]) 03/30/2015   Anemia 03/23/2015   Chest pain 07/28/2014    History reviewed. No pertinent surgical history.  Prior to Admission medications   Medication Sig Start Date End Date Taking? Authorizing Provider  albuterol (PROVENTIL HFA;VENTOLIN HFA) 108 (90 Base) MCG/ACT inhaler Inhale 2 puffs into the lungs every 6 (six) hours as needed for wheezing or shortness of breath. 07/24/17   Laban Emperor, PA-C  azithromycin (ZITHROMAX Z-PAK) 250 MG tablet Take 2 tablets (500 mg) on  Day 1,  followed by 1 tablet (250 mg) once daily on Days 2 through 5. 07/24/17   Laban Emperor, PA-C  benzonatate (TESSALON PERLES) 100 MG capsule Take 1 capsule (100 mg total) by mouth 3 (three) times daily as needed for cough. 07/24/17 07/24/18  Laban Emperor, PA-C  loperamide (IMODIUM A-D) 2 MG tablet Take 1 tablet (2 mg total) by mouth 4 (four) times daily as needed for diarrhea or loose stools. 08/05/16   Schaevitz, [place] An, MD  LORazepam (ATIVAN) 1 MG tablet Take 1 tablet (1 mg total) by mouth 2 (two) times daily as needed for anxiety. 04/20/16   Daymon Larsen, MD  metFORMIN (GLUCOPHAGE) 500  MG tablet Take 1 tablet (500 mg total) by mouth 2 (two) times daily with a meal. 06/14/16 06/14/17  Letitia Neri L, PA-C  metroNIDAZOLE (FLAGYL) 500 MG tablet Take 1 tablet (500 mg total) by mouth 2 (two) times daily. 06/14/16   Johnn Hai, PA-C  ondansetron (ZOFRAN ODT) 4 MG disintegrating tablet Take 1 tablet (4 mg total) by mouth every 6 (six) hours as needed for nausea or vomiting. 02/03/17   Delman Kitten, MD  predniSONE (DELTASONE) 10 MG tablet Take 6 tablets on day 1, take 5 tablets on day 2, take 4 tablets on day 3, take 3 tablets on day 4, take 2 tablets on day 5, take 1 tablet on day 6 07/24/17   Laban Emperor, PA-C  sulfamethoxazole-trimethoprim (BACTRIM DS,SEPTRA DS) 800-160 MG tablet Take 1 tablet by mouth 2 (two) times daily. 06/14/16   Johnn Hai, PA-C    Allergies Morphine and related  Family History  Problem Relation Age of Onset   Cancer Mother        Breast   Diabetes Father    Aneurysm Father        Brain    Social History Social History   Tobacco Use   Smoking status: Never Smoker   Smokeless tobacco: Never Used  Substance Use Topics   Alcohol use: No   Drug use: No     Review of Systems  Constitutional: No fever/chills Eyes: No  visual changes. No discharge. ENT: Negative for congestion and rhinorrhea. Cardiovascular: No chest pain. Respiratory: Positive for cough. No SOB. Gastrointestinal: No abdominal pain.  No nausea, no vomiting.  No diarrhea.  No constipation. Musculoskeletal: Negative for musculoskeletal pain. Skin: Negative for rash, abrasions, lacerations, ecchymosis. Neurological: Negative for headaches.   ____________________________________________   PHYSICAL EXAM:  VITAL SIGNS: ED Triage Vitals  Enc Vitals Group     BP 07/24/17 1926 (!) 142/78     Pulse Rate 07/24/17 1926 98     Resp 07/24/17 1926 16     Temp 07/24/17 1926 99 F (37.2 C)     Temp Source 07/24/17 1926 Oral     SpO2 07/24/17 1926 97 %      Weight 07/24/17 1926 190 lb (86.2 kg)     Height 07/24/17 1926 5\' 4"  (1.626 m)     Head Circumference --      Peak Flow --      Pain Score 07/24/17 1932 7     Pain Loc --      Pain Edu? --      Excl. in [place]? --      Constitutional: Alert and oriented. Well appearing and in no acute distress. Eyes: Conjunctivae are normal. PERRL. EOMI. No discharge. Head: Atraumatic. ENT: No frontal and maxillary sinus tenderness.      Ears: Tympanic membranes pearly gray with good landmarks. No discharge.      Nose: No congestion/rhinnorhea.      Mouth/Throat: Mucous membranes are moist. Oropharynx non-erythematous. Tonsils not enlarged. No exudates. Uvula midline. Neck: No stridor.   Hematological/Lymphatic/Immunilogical: No cervical lymphadenopathy. Cardiovascular: Normal rate, regular rhythm.  Good peripheral circulation. Respiratory: Normal respiratory effort without tachypnea or retractions. Lungs CTAB. Good air entry to the bases with no decreased or absent breath sounds. Gastrointestinal: Bowel sounds 4 quadrants. Soft and nontender to palpation. No guarding or rigidity. No palpable masses. No distention. Musculoskeletal: Full range of motion to all extremities. No gross deformities appreciated. Neurologic:  Normal speech and language. No gross focal neurologic deficits are appreciated.  Skin:  Skin is warm, dry and intact. No rash noted.   ____________________________________________   LABS (all labs ordered are listed, but only abnormal results are displayed)  Labs Reviewed - No data to display ____________________________________________  EKG   ____________________________________________  RADIOLOGY Robinette Haines, personally viewed and evaluated these images (plain radiographs) as part of my medical decision making, as well as reviewing the written report by the radiologist.  Dg Chest 2 View  Result Date: 07/24/2017 CLINICAL DATA:  Cough and cold symptoms ongoing for 3 weeks.  EXAM: CHEST  2 VIEW COMPARISON:  04/20/2016 FINDINGS: The heart size and mediastinal contours are within normal limits. Both lungs are clear. The visualized skeletal structures are unremarkable. IMPRESSION: No active cardiopulmonary disease. Electronically Signed   By: Madolin Twaddle Royalty M.D.   On: 07/24/2017 20:49    ____________________________________________    PROCEDURES  Procedure(s) performed:    Procedures    Medications  ipratropium-albuterol (DUONEB) 0.5-2.5 (3) MG/3ML nebulizer solution 3 mL (3 mLs Nebulization Given 07/24/17 2118)     ____________________________________________   INITIAL IMPRESSION / ASSESSMENT AND PLAN / ED COURSE  Pertinent labs & imaging results that were available during my care of the patient were reviewed by me and considered in my medical decision making (see chart for details).  Review of the [place] CSRS was performed in accordance of the [place] prior to dispensing any controlled drugs.   Patient's  diagnosis is consistent with bronchitis. Vital signs and exam are reassuring.  Chest x-ray negative for acute cardiopulmonary processes.  She was given DuoNeb in ED. Patient feels comfortable going home. Patient will be discharged home with prescriptions for azithromycin, prednisone, Tessalon Perles, albuterol inhaler. Patient is to follow up with PCP as needed or otherwise directed. Patient is given ED precautions to return to the ED for any worsening or new symptoms.     ____________________________________________  FINAL CLINICAL IMPRESSION(S) / ED DIAGNOSES  Final diagnoses:  Bronchitis      NEW MEDICATIONS STARTED DURING THIS VISIT:  ED Discharge Orders        Ordered    azithromycin (ZITHROMAX Z-PAK) 250 MG tablet     07/24/17 2119    predniSONE (DELTASONE) 10 MG tablet     07/24/17 2119    benzonatate (TESSALON PERLES) 100 MG capsule  3 times daily PRN     07/24/17 2119    albuterol (PROVENTIL HFA;VENTOLIN HFA) 108 (90 Base) MCG/ACT  inhaler  Every 6 hours PRN     07/24/17 2119          This chart was dictated using voice recognition software/Dragon. Despite best efforts to proofread, errors can occur which can change the meaning. Any change was purely unintentional.    [place] Emperor, PA-C 07/24/17 2325    Harvest Dark, MD 07/25/17 [phone]

## 2017-07-24 NOTE — ED Triage Notes (Addendum)
Pt comes into the ED via cough and cold symptoms that have been ongoing for 3 weeks.  Patient denies any fevers at home.  Patient explains that the cough is a productive cough with yellow sputum.  Denies any N/V/D.  Patient is in NAd at this time with even and unlabored respirations.  Patient alert and oriented x4 and was ambulatory to triage at this time. Patient states she has been increasingly dizzy since the the cold symptoms started.

## 2017-08-28 ENCOUNTER — Emergency Department: Payer: Self-pay

## 2017-08-28 ENCOUNTER — Emergency Department
Admission: EM | Admit: 2017-08-28 | Discharge: 2017-08-28 | Disposition: A | Discharge status: Home / Self Care | Payer: Self-pay | Attending: Emergency Medicine | Admitting: Emergency Medicine

## 2017-08-28 DIAGNOSIS — R531 Weakness: Secondary | ICD-10-CM | POA: Insufficient documentation

## 2017-08-28 DIAGNOSIS — R739 Hyperglycemia, unspecified: Secondary | ICD-10-CM | POA: Insufficient documentation

## 2017-08-28 DIAGNOSIS — E119 Type 2 diabetes mellitus without complications: Secondary | ICD-10-CM | POA: Insufficient documentation

## 2017-08-28 DIAGNOSIS — J45909 Unspecified asthma, uncomplicated: Secondary | ICD-10-CM | POA: Insufficient documentation

## 2017-08-28 DIAGNOSIS — R202 Paresthesia of skin: Secondary | ICD-10-CM | POA: Insufficient documentation

## 2017-08-28 DIAGNOSIS — R079 Chest pain, unspecified: Secondary | ICD-10-CM

## 2017-08-28 LAB — DIFFERENTIAL
Basophils Absolute: 0.1 10*3/uL (ref 0–0.1)
Basophils Relative: 1 %
EOS PCT: 3 %
Eosinophils Absolute: 0.3 10*3/uL (ref 0–0.7)
LYMPHS ABS: 2.7 10*3/uL (ref 1.0–3.6)
LYMPHS PCT: 31 %
MONOS PCT: 7 %
Monocytes Absolute: 0.6 10*3/uL (ref 0.2–0.9)
NEUTROS PCT: 58 %
Neutro Abs: 5.2 10*3/uL (ref 1.4–6.5)

## 2017-08-28 LAB — CBC
HEMATOCRIT: 38.2 % (ref 35.0–47.0)
HEMOGLOBIN: 13.1 g/dL (ref 12.0–16.0)
MCH: 27.6 pg (ref 26.0–34.0)
MCHC: 34.3 g/dL (ref 32.0–36.0)
MCV: 80.7 fL (ref 80.0–100.0)
Platelets: 321 10*3/uL (ref 150–440)
RBC: 4.73 MIL/uL (ref 3.80–5.20)
RDW: 13.5 % (ref 11.5–14.5)
WBC: 9 10*3/uL (ref 3.6–11.0)

## 2017-08-28 LAB — COMPREHENSIVE METABOLIC PANEL
ALBUMIN: 3.4 g/dL — AB (ref 3.5–5.0)
ALK PHOS: 75 U/L (ref 38–126)
ALT: 13 U/L — ABNORMAL LOW (ref 14–54)
ANION GAP: 9 (ref 5–15)
AST: 18 U/L (ref 15–41)
BILIRUBIN TOTAL: 0.4 mg/dL (ref 0.3–1.2)
BUN: 21 mg/dL — AB (ref 6–20)
CALCIUM: 9.1 mg/dL (ref 8.9–10.3)
CO2: 22 mmol/L (ref 22–32)
Chloride: 101 mmol/L (ref 101–111)
Creatinine, Ser: 0.96 mg/dL (ref 0.44–1.00)
GFR calc Af Amer: >60 mL/min (ref >60)
GFR calc non Af Amer: >60 mL/min (ref >60)
Glucose, Bld: 383 mg/dL — ABNORMAL HIGH (ref 65–99)
Potassium: 4.5 mmol/L (ref 3.5–5.1)
Sodium: 132 mmol/L — ABNORMAL LOW (ref 135–145)
TOTAL PROTEIN: 7.3 g/dL (ref 6.5–8.1)

## 2017-08-28 LAB — PROTIME-INR
INR: 0.97
Prothrombin Time: 12.8 seconds (ref 11.4–15.2)

## 2017-08-28 LAB — TROPONIN I
Troponin I: <0.03 ng/mL (ref <0.03)
Troponin I: <0.03 ng/mL (ref <0.03)

## 2017-08-28 LAB — GLUCOSE, CAPILLARY: Glucose-Capillary: 356 mg/dL — ABNORMAL HIGH (ref 65–99)

## 2017-08-28 LAB — APTT: aPTT: <24 seconds — ABNORMAL LOW (ref 24–36)

## 2017-08-28 MED ORDER — ASPIRIN EC 325 MG PO TBEC: 325.0000 mg | DELAYED_RELEASE_TABLET | Freq: Every day | ORAL | 3 refills | Status: AC

## 2017-08-28 MED ORDER — ASPIRIN 81 MG PO CHEW
324.0000 mg | CHEWABLE_TABLET | Freq: Once | ORAL | Status: AC
  Administered 2017-08-28: 324 mg via ORAL
  Filled 2017-08-28: qty 4

## 2017-08-28 NOTE — ED Triage Notes (Signed)
Pt reports at 0700 this morning she started feeling numb on the left side of her face. Pt also reports some CP and feet feel cold. Weakness noted to left side grip. Drooping noted to left side of face.

## 2017-08-28 NOTE — ED Notes (Signed)
Pt remains supine

## 2017-08-28 NOTE — ED Notes (Signed)
Pt woke up at 530 am and started with CP at 7 am then tingling in left face at 730 am and tingling in fingers 8 am.  Began with tingling in left foot on way here.

## 2017-08-28 NOTE — ED Notes (Signed)
Pt back from MRI.

## 2017-08-28 NOTE — Consult Note (Signed)
Referring Physician: Jimmye Norman    Chief Complaint:  Left sided numbness, chest pain  HPI: PEYTEN PUNCHES is an 47 y.o. female who reports awakening today at baseline.  Patient then developed chest pain.  After developing chest pain noted that the left side of her face started tingling.  This tingling travelled to involve the LUE and LLE.  Patient presented for evaluation at that time.  Initial NIHSS of 4.    Date last known well: Date: 08/28/2017 Time last known well: Time: 08:00 tPA Given: No: Minimal symptoms  Past Medical History:  Diagnosis Date   Asthma    Diabetes mellitus without complication ([place])    History of venomous spider bite 2015   Hospital inpatient tx for 1 week.   Hyperlipidemia    Migraines     No past surgical history on file.  Family History  Problem Relation Age of Onset   Cancer Mother        Breast   Diabetes Father    Aneurysm Father        Brain   Social History:  reports that  has never smoked. she has never used smokeless tobacco. She reports that she does not drink alcohol or use drugs.  Allergies:  Allergies  Allergen Reactions   Morphine And Related Other (See Comments)    confusion    Medications: I have reviewed the patient's current medications. Prior to Admission:  Prior to Admission medications   Medication Sig Start Date End Date Taking? Authorizing Provider  albuterol (PROVENTIL HFA;VENTOLIN HFA) 108 (90 Base) MCG/ACT inhaler Inhale 2 puffs into the lungs every 6 (six) hours as needed for wheezing or shortness of breath. 07/24/17   Laban Emperor, PA-C  azithromycin (ZITHROMAX Z-PAK) 250 MG tablet Take 2 tablets (500 mg) on  Day 1,  followed by 1 tablet (250 mg) once daily on Days 2 through 5. 07/24/17   Laban Emperor, PA-C  benzonatate (TESSALON PERLES) 100 MG capsule Take 1 capsule (100 mg total) by mouth 3 (three) times daily as needed for cough. 07/24/17 07/24/18  Laban Emperor, PA-C  loperamide (IMODIUM A-D) 2 MG tablet Take 1  tablet (2 mg total) by mouth 4 (four) times daily as needed for diarrhea or loose stools. 08/05/16   Schaevitz, [place] An, MD  LORazepam (ATIVAN) 1 MG tablet Take 1 tablet (1 mg total) by mouth 2 (two) times daily as needed for anxiety. 04/20/16   Daymon Larsen, MD  metFORMIN (GLUCOPHAGE) 500 MG tablet Take 1 tablet (500 mg total) by mouth 2 (two) times daily with a meal. 06/14/16 06/14/17  Letitia Neri L, PA-C  metroNIDAZOLE (FLAGYL) 500 MG tablet Take 1 tablet (500 mg total) by mouth 2 (two) times daily. 06/14/16   Johnn Hai, PA-C  ondansetron (ZOFRAN ODT) 4 MG disintegrating tablet Take 1 tablet (4 mg total) by mouth every 6 (six) hours as needed for nausea or vomiting. 02/03/17   Delman Kitten, MD  predniSONE (DELTASONE) 10 MG tablet Take 6 tablets on day 1, take 5 tablets on day 2, take 4 tablets on day 3, take 3 tablets on day 4, take 2 tablets on day 5, take 1 tablet on day 6 07/24/17   [place] Emperor, PA-C  sulfamethoxazole-trimethoprim (BACTRIM DS,SEPTRA DS) 800-160 MG tablet Take 1 tablet by mouth 2 (two) times daily. 06/14/16   Letitia Neri L, PA-C     ROS: History obtained from the patient  General ROS: negative for - chills, fatigue, fever, night  sweats, weight gain or weight loss Psychological ROS: negative for - behavioral disorder, hallucinations, memory difficulties, mood swings or suicidal ideation Ophthalmic ROS: negative for - blurry vision, double vision, eye pain or loss of vision ENT ROS: negative for - epistaxis, nasal discharge, oral lesions, sore throat, tinnitus or vertigo Allergy and Immunology ROS: negative for - hives or itchy/watery eyes Hematological and Lymphatic ROS: negative for - bleeding problems, bruising or swollen lymph nodes Endocrine ROS: negative for - galactorrhea, hair pattern changes, polydipsia/polyuria or temperature intolerance Respiratory ROS: negative for - cough, hemoptysis, shortness of breath or wheezing Cardiovascular ROS:  chest pain Gastrointestinal ROS: negative for - abdominal pain, diarrhea, hematemesis, nausea/vomiting or stool incontinence Genito-Urinary ROS: negative for - dysuria, hematuria, incontinence or urinary frequency/urgency Musculoskeletal ROS: left shoulder and left leg pain Neurological ROS: as noted in HPI Dermatological ROS: negative for rash and skin lesion changes  Physical Examination: Blood pressure (!) 144/100, pulse (!) 101, temperature 98.3 F (36.8 C), temperature source Oral, resp. rate 18, height 5\' 4"  (1.626 m), weight 81.6 kg (180 lb), SpO2 96 %.  HEENT-  Normocephalic, no lesions, without obvious abnormality.  Normal external eye and conjunctiva.  Normal TM's bilaterally.  Normal auditory canals and external ears. Normal external nose, mucus membranes and septum.  Normal pharynx. Cardiovascular- S1, S2 normal, pulses palpable throughout   Lungs- chest clear, no wheezing, rales, normal symmetric air entry Abdomen- soft, non-tender; bowel sounds normal; no masses,  no organomegaly Extremities- no edema Lymph-no adenopathy palpable Musculoskeletal-pain on palpation of the left shoulder Skin-warm and dry, no hyperpigmentation, vitiligo, or suspicious lesions  Neurological Examination   Mental Status: Alert, oriented, thought content appropriate.  Speech fluent without evidence of aphasia.  Able to follow 3 step commands without difficulty. Cranial Nerves: II: Discs flat bilaterally; Visual fields grossly normal, pupils equal, round, reactive to light and accommodation III,IV, VI: ptosis not present, extra-ocular motions intact bilaterally V,VII: decrease in left NLF, facial light touch sensation decreased on the left VIII: hearing normal bilaterally IX,X: gag reflex present XI: bilateral shoulder shrug XII: midline tongue extension Motor: Right : Upper extremity   5/5    Left:     Upper extremity   4/5 limited by pain  Lower extremity   5-/5 Limited by pain  Lower  extremity   5-/5 limited by pain Tone and bulk:normal tone throughout; no atrophy noted Sensory: Pinprick and light touch decreased on the left Deep Tendon Reflexes: 2+ and symmetric with absent  Plantars: Right: mute   Left: mute Cerebellar: Normal finger-to-nose and normal heel-to-shin testing bilaterally Gait: not tested due to safety concerns    Laboratory Studies:  Basic Metabolic Panel: No results for input(s): NA, K, CL, CO2, GLUCOSE, BUN, CREATININE, CALCIUM, MG, PHOS in the last 168 hours.  Liver Function Tests: No results for input(s): AST, ALT, ALKPHOS, BILITOT, PROT, ALBUMIN in the last 168 hours. No results for input(s): LIPASE, AMYLASE in the last 168 hours. No results for input(s): AMMONIA in the last 168 hours.  CBC: No results for input(s): WBC, NEUTROABS, HGB, HCT, MCV, PLT in the last 168 hours.  Cardiac Enzymes: No results for input(s): CKTOTAL, CKMB, CKMBINDEX, TROPONINI in the last 168 hours.  BNP: Invalid input(s): POCBNP  CBG: Recent Labs  Lab 08/28/17 1021  GLUCAP 356*    Microbiology: Results for orders placed or performed during the hospital encounter of 08/05/16  Urine culture     Status: Abnormal   Collection Time: 08/05/16 11:59 AM  Result Value Ref Range Status   Specimen Description URINE, RANDOM  Final   Special Requests NONE  Final   Culture MULTIPLE SPECIES PRESENT, SUGGEST RECOLLECTION (A)  Final   Report Status 08/07/2016 FINAL  Final    Coagulation Studies: No results for input(s): LABPROT, INR in the last 72 hours.  Urinalysis: No results for input(s): COLORURINE, LABSPEC, PHURINE, GLUCOSEU, HGBUR, BILIRUBINUR, KETONESUR, PROTEINUR, UROBILINOGEN, NITRITE, LEUKOCYTESUR in the last 168 hours.  Invalid input(s): APPERANCEUR  Lipid Panel:    Component Value Date/Time   CHOL 180 11/06/2012   TRIG 117 11/06/2012   HDL 60 11/06/2012   LDLCALC 97 11/06/2012    HgbA1C:  Lab Results  Component Value Date   HGBA1C 10.9  04/27/2015    Urine Drug Screen:      Component Value Date/Time   LABOPIA NEGATIVE 07/20/2013 0907   COCAINSCRNUR NEGATIVE 07/20/2013 0907   LABBENZ NEGATIVE 07/20/2013 0907   AMPHETMU NEGATIVE 07/20/2013 0907   THCU NEGATIVE 07/20/2013 0907   LABBARB NEGATIVE 07/20/2013 0907    Alcohol Level: No results for input(s): ETH in the last 168 hours.  Other results: EKG: normal sinus rhythm at 96 bpm.  Imaging: Ct Head Code Stroke Wo Contrast  Result Date: 08/28/2017 CLINICAL DATA:  Code stroke. Acute onset of left-sided facial droop and left-sided weakness beginning at 7 o'clock a.m. today, 3 hours ago. EXAM: CT HEAD WITHOUT CONTRAST TECHNIQUE: Contiguous axial images were obtained from the base of the skull through the vertex without intravenous contrast. COMPARISON:  CT head without contrast 10/04/2015 FINDINGS: Brain: No acute infarct, hemorrhage, or mass lesion is present. Basal ganglia are intact. Insular ribbon is within normal limits. No focal cortical abnormality is present. Gray-white differentiation is preserved. Ventricles are of normal size. No significant extraaxial fluid collection is present. The brainstem and cerebellum are normal. Vascular: No hyperdense vessel or unexpected calcification. Skull: Calvarium is intact. No focal lytic or blastic lesions are present. Sinuses/Orbits: The paranasal sinuses and mastoid air cells are clear. Globes and orbits are within normal limits. ASPECTS [facility] Stroke Program Early CT Score) - Ganglionic level infarction (caudate, lentiform nuclei, internal capsule, insula, M1-M3 cortex): 7/7 - Supraganglionic infarction (M4-M6 cortex): 3/3 Total score (0-10 with 10 being normal): 10/10 IMPRESSION: 1. Negative CT of the head. 2. ASPECTS is 10/10 These results were called by telephone at the time of interpretation on 08/28/2017 at 10:22 am to Dr. Lenise Arena , who verbally acknowledged these results. Electronically Signed   By: San Morelle  M.D.   On: 08/28/2017 10:23    Assessment: 47 y.o. female with a history of DM and HLD who presents with complaints of chest pain and left sided numbness.  Patient on no antiplatelet therapy at home.  Symptoms minimal at this time with pain being the most significant complaint.  Will not administer tPA.  Further work up recommended.    Stroke Risk Factors - diabetes mellitus and hyperlipidemia  Plan: 1. HgbA1c, fasting lipid panel 2. MRI of the brain without contrast.  Stroke work up to be initiated if diagnostic for an acute infarct.   3. ASA 325 mg 4. NPO until RN stroke swallow screen 5. Telemetry monitoring 6. Frequent neuro checks  Case discussed with Dr. Simon Rhein, MD Neurology [phone] 08/28/2017, 10:32 AM

## 2017-08-28 NOTE — ED Notes (Signed)
Pt alert and oriented X4, active, cooperative, pt in NAD. RR even and unlabored, color WNL.  Pt informed to return if any life threatening symptoms occur.  Discharge and followup instructions reviewed.

## 2017-08-28 NOTE — ED Provider Notes (Signed)
[facility] Emergency Department Provider Note       Time seen: ----------------------------------------- 10:25 AM on 08/28/2017 -----------------------------------------   I have reviewed the triage vital signs and the nursing notes.  HISTORY   Chief Complaint Chest Pain and Numbness    HPI Wendy Frazier is a 47 y.o. female with a history of asthma, diabetes, hyperlipidemia and migraines who presents to the ED for chest pain as well as numbness in the left side of her face and left arm.  Patient also reports some chest discomfort at this time.  She states her feet felt cold.  She states she has had chest pain in the past with a panic attack and she does not think she was having a panic attack today.  There was some weakness noted in triage with her left sided grip.  Past Medical History:  Diagnosis Date   Asthma    Diabetes mellitus without complication ([place])    History of venomous spider bite 2015   Hospital inpatient tx for 1 week.   Hyperlipidemia    Migraines     Patient Active Problem List   Diagnosis Date Noted   Diabetes ([place]) 03/30/2015   Anemia 03/23/2015   Chest pain 07/28/2014    No past surgical history on file.  Allergies Morphine and related  Social History Social History   Tobacco Use   Smoking status: Never Smoker   Smokeless tobacco: Never Used  Substance Use Topics   Alcohol use: No   Drug use: No    Review of Systems Constitutional: Negative for fever. Cardiovascular: positive for chest pain Respiratory: Negative for shortness of breath. Gastrointestinal: Negative for abdominal pain, vomiting and diarrhea. Genitourinary: Negative for dysuria. Musculoskeletal: Negative for back pain. Skin: Negative for rash. Neurological: Positive for weakness and numbness  All systems negative/normal/unremarkable except as stated in the HPI  ____________________________________________   PHYSICAL EXAM:  VITAL  SIGNS: ED Triage Vitals  Enc Vitals Group     BP 08/28/17 1007 (!) 144/100     Pulse Rate 08/28/17 1007 (!) 101     Resp 08/28/17 1007 18     Temp 08/28/17 1007 98.3 F (36.8 C)     Temp Source 08/28/17 1007 Oral     SpO2 08/28/17 1007 96 %     Weight 08/28/17 1012 180 lb (81.6 kg)     Height 08/28/17 1011 5\' 4"  (1.626 m)     Head Circumference --      Peak Flow --      Pain Score 08/28/17 1011 10     Pain Loc --      Pain Edu? --      Excl. in [place]? --    Constitutional: Alert and oriented. Well appearing and in no distress. Eyes: Conjunctivae are normal. Normal extraocular movements. ENT   Head: Normocephalic and atraumatic.   Nose: No congestion/rhinnorhea.   Mouth/Throat: Mucous membranes are moist.   Neck: No stridor. Cardiovascular: Normal rate, regular rhythm. No murmurs, rubs, or gallops. Respiratory: Normal respiratory effort without tachypnea nor retractions. Breath sounds are clear and equal bilaterally. No wheezes/rales/rhonchi. Gastrointestinal: Soft and nontender. Normal bowel sounds Musculoskeletal: Nontender with normal range of motion in extremities. No lower extremity tenderness nor edema. Neurologic: Mildly slurred speech is noted.  Some decreased sensation on the left face, left arm and left leg.  No focal weakness is noted at this time. Skin:  Skin is warm, dry and intact. No rash noted. Psychiatric: Mood and  affect are normal. Speech and behavior are normal.  ____________________________________________  EKG: Interpreted by me.  Sinus rhythm the rate of 96 bpm, normal PR interval, normal QRS, normal QT.  ____________________________________________  ED COURSE:  As part of my medical decision making, I reviewed the following data within the [place] History obtained from family if available, nursing notes, old chart and ekg, as well as notes from prior ED visits. Patient presented for chest pain as well as weakness and some  paresthesia, we will assess with labs and imaging as indicated at this time.   Procedures ____________________________________________   LABS (pertinent positives/negatives)  Labs Reviewed  APTT - Abnormal; Notable for the following components:      Result Value   aPTT <24 (*)    All other components within normal limits  COMPREHENSIVE METABOLIC PANEL - Abnormal; Notable for the following components:   Sodium 132 (*)    Glucose, Bld 383 (*)    BUN 21 (*)    Albumin 3.4 (*)    ALT 13 (*)    All other components within normal limits  GLUCOSE, CAPILLARY - Abnormal; Notable for the following components:   Glucose-Capillary 356 (*)    All other components within normal limits  PROTIME-INR  CBC  DIFFERENTIAL  TROPONIN I  TROPONIN I  CBG MONITORING, ED   CRITICAL CARE Performed by: Laurence Aly   Total critical care time: 30 minutes  Critical care time was exclusive of separately billable procedures and treating other patients.  Critical care was necessary to treat or prevent imminent or life-threatening deterioration.  Critical care was time spent personally by me on the following activities: development of treatment plan with patient and/or surrogate as well as nursing, discussions with consultants, evaluation of patient's response to treatment, examination of patient, obtaining history from patient or surrogate, ordering and performing treatments and interventions, ordering and review of laboratory studies, ordering and review of radiographic studies, pulse oximetry and re-evaluation of patient's condition.  RADIOLOGY Images were viewed by me  CT head/brain MRI IMPRESSION: Negative brain MRI. No explanation for symptoms. ____________________________________________  DIFFERENTIAL DIAGNOSIS   Unstable angina, muscular skeletal pain, panic attack, GERD, PE, pneumothorax, CVA  FINAL ASSESSMENT AND PLAN  Chest pain, weakness, paresthesia   Plan: Patient had  presented for chest pain and also for numbness and weakness.  Initially she was made a code stroke and she was evaluated by neurology, had a stat CT head which was negative. Patient's labs did reveal hyperglycemia but were otherwise negative.  Her blood pressure here has been normal and repeat troponin was negative. Patient's imaging was negative on CT and MRI subsequently did not reveal any infarct.  I discussed with neurology and she is cleared for outpatient follow-up.   Laurence Aly, MD   Note: This note was generated in part or whole with voice recognition software. Voice recognition is usually quite accurate but there are transcription errors that can and very often do occur. I apologize for any typographical errors that were not detected and corrected.     Earleen Newport, MD 08/28/17 [id number]

## 2017-11-12 ENCOUNTER — Emergency Department
Admission: EM | Admit: 2017-11-12 | Discharge: 2017-11-12 | Disposition: A | Discharge status: Home / Self Care | Payer: Self-pay | Attending: Emergency Medicine | Admitting: Emergency Medicine

## 2017-11-12 ENCOUNTER — Encounter: Payer: Self-pay | Admitting: Physician Assistant

## 2017-11-12 DIAGNOSIS — Z7982 Long term (current) use of aspirin: Secondary | ICD-10-CM | POA: Insufficient documentation

## 2017-11-12 DIAGNOSIS — E119 Type 2 diabetes mellitus without complications: Secondary | ICD-10-CM | POA: Insufficient documentation

## 2017-11-12 DIAGNOSIS — J45909 Unspecified asthma, uncomplicated: Secondary | ICD-10-CM | POA: Insufficient documentation

## 2017-11-12 DIAGNOSIS — B372 Candidiasis of skin and nail: Secondary | ICD-10-CM

## 2017-11-12 DIAGNOSIS — Z7984 Long term (current) use of oral hypoglycemic drugs: Secondary | ICD-10-CM | POA: Insufficient documentation

## 2017-11-12 MED ORDER — CLOTRIMAZOLE-BETAMETHASONE 1-0.05 % EX CREA: 1.0000 "application " | TOPICAL_CREAM | Freq: Two times a day (BID) | CUTANEOUS | 1 refills | Status: DC

## 2017-11-12 MED ORDER — FLUCONAZOLE 150 MG PO TABS: 150.0000 mg | ORAL_TABLET | ORAL | 0 refills | Status: DC

## 2017-11-12 NOTE — ED Notes (Signed)
Pt ambulatory without difficulty. VSS. NAD. Discharge instructions, and follow up discussed. All questions were addressed.

## 2017-11-12 NOTE — Discharge Instructions (Addendum)
You exam shows yeast infection of the skin. Use the prescription cream as directed. Take the prescription pills as directed. Start using OTC dandruff shampoo as a bodywash weekly. Apply Vaseline (petroleum jelly) Follow-up with one of the local community clinics for routine medical care.

## 2017-11-12 NOTE — ED Triage Notes (Signed)
Pt reports red rash to left flank, across abdomen and in bilateral groins x3 months.

## 2017-11-12 NOTE — ED Provider Notes (Signed)
[facility] Emergency Department Provider Note ____________________________________________  Time seen: 1642  I have reviewed the triage vital signs and the nursing notes.  HISTORY  Chief Complaint  Rash  HPI Wendy Frazier is a 47 y.o. female with a history of DM presents to the ED for evaluation of a persistent rash for the last 6+ months.  Describes an itchy, red rash that extends around her lower abdomen under her pannus, and involves her bilateral flank.  She also has some similar rash extending into the intertriginous areas of her thighs.  She denies any fevers, chills, or sweats.  She has had no relief with over-the-counter diaper rash cream.  She was told before that her rash is related to her diabetes condition.  She denies any abscesses, boils, purulent drainage, or lymphangitis.  Past Medical History:  Diagnosis Date   Asthma    Diabetes mellitus without complication ([place])    History of venomous spider bite 2015   Hospital inpatient tx for 1 week.   Hyperlipidemia    Migraines     Patient Active Problem List   Diagnosis Date Noted   Diabetes ([place]) 03/30/2015   Anemia 03/23/2015   Chest pain 07/28/2014    History reviewed. No pertinent surgical history.  Prior to Admission medications   Medication Sig Start Date End Date Taking? Authorizing Provider  albuterol (PROVENTIL HFA;VENTOLIN HFA) 108 (90 Base) MCG/ACT inhaler Inhale 2 puffs into the lungs every 6 (six) hours as needed for wheezing or shortness of breath. 07/24/17   Laban Emperor, PA-C  aspirin EC 325 MG tablet Take 1 tablet (325 mg total) by mouth daily. 08/28/17 08/28/18  Earleen Newport, MD  azithromycin (ZITHROMAX Z-PAK) 250 MG tablet Take 2 tablets (500 mg) on  Day 1,  followed by 1 tablet (250 mg) once daily on Days 2 through 5. Patient not taking: Reported on 08/28/2017 07/24/17   Laban Emperor, PA-C  benzonatate (TESSALON PERLES) 100 MG capsule Take 1 capsule (100 mg total) by  mouth 3 (three) times daily as needed for cough. Patient not taking: Reported on 08/28/2017 07/24/17 07/24/18  Laban Emperor, PA-C  clotrimazole-betamethasone (LOTRISONE) cream Apply 1 application topically 2 (two) times daily. 11/12/17   Forest Pruden, Dannielle Karvonen, PA-C  fluconazole (DIFLUCAN) 150 MG tablet Take 1 tablet (150 mg total) by mouth once a week. 11/12/17   Delmi Fulfer, Dannielle Karvonen, PA-C  loperamide (IMODIUM A-D) 2 MG tablet Take 1 tablet (2 mg total) by mouth 4 (four) times daily as needed for diarrhea or loose stools. Patient not taking: Reported on 08/28/2017 08/05/16   Schaevitz, Randall An, MD  LORazepam (ATIVAN) 1 MG tablet Take 1 tablet (1 mg total) by mouth 2 (two) times daily as needed for anxiety. Patient not taking: Reported on 08/28/2017 04/20/16   Daymon Larsen, MD  metFORMIN (GLUCOPHAGE) 500 MG tablet Take 1 tablet (500 mg total) by mouth 2 (two) times daily with a meal. 06/14/16 08/28/17  Johnn Hai, PA-C  metroNIDAZOLE (FLAGYL) 500 MG tablet Take 1 tablet (500 mg total) by mouth 2 (two) times daily. Patient not taking: Reported on 08/28/2017 06/14/16   Letitia Neri L, PA-C  ondansetron (ZOFRAN ODT) 4 MG disintegrating tablet Take 1 tablet (4 mg total) by mouth every 6 (six) hours as needed for nausea or vomiting. Patient not taking: Reported on 08/28/2017 02/03/17   Delman Kitten, MD  predniSONE (DELTASONE) 10 MG tablet Take 6 tablets on day 1, take 5 tablets on  day 2, take 4 tablets on day 3, take 3 tablets on day 4, take 2 tablets on day 5, take 1 tablet on day 6 Patient not taking: Reported on 08/28/2017 07/24/17   Laban Emperor, PA-C  sulfamethoxazole-trimethoprim (BACTRIM DS,SEPTRA DS) 800-160 MG tablet Take 1 tablet by mouth 2 (two) times daily. Patient not taking: Reported on 08/28/2017 06/14/16   Johnn Hai, PA-C    Allergies Morphine and related  Family History  Problem Relation Age of Onset   Cancer Mother        Breast   Diabetes Father    Aneurysm Father         Brain    Social History Social History   Tobacco Use   Smoking status: Never Smoker   Smokeless tobacco: Never Used  Substance Use Topics   Alcohol use: No   Drug use: No    Review of Systems  Constitutional: Negative for fever. Eyes: Negative for visual changes. ENT: Negative for sore throat.  No oral thrush. Cardiovascular: Negative for chest pain. Respiratory: Negative for shortness of breath. Musculoskeletal: Negative for back pain. Skin: Positive for rash. ____________________________________________  PHYSICAL EXAM:  VITAL SIGNS: ED Triage Vitals  Enc Vitals Group     BP 11/12/17 1610 (!) 157/93     Pulse Rate 11/12/17 1610 99     Resp 11/12/17 1610 16     Temp 11/12/17 1609 99.3 F (37.4 C)     Temp Source 11/12/17 1609 Oral     SpO2 11/12/17 1610 100 %     Weight 11/12/17 1609 195 lb (88.5 kg)     Height 11/12/17 1609 5\' 4"  (1.626 m)     Head Circumference --      Peak Flow --      Pain Score 11/12/17 1609 10     Pain Loc --      Pain Edu? --      Excl. in [place]? --     Constitutional: Alert and oriented. Well appearing and in no distress. Head: Normocephalic and atraumatic. Eyes: Conjunctivae are normal. Normal extraocular movements Mouth/Throat: Mucous membranes are moist. Cardiovascular: Normal rate, regular rhythm. Normal distal pulses. Respiratory: Normal respiratory effort. No wheezes/rales/rhonchi. Gastrointestinal: protuberant, soft and nontender. No distention. Skin:  Skin is warm, dry and intact.  Erythematous maculopapular rash to the intertriginous areas of her pannus and lower abdomen.  She also has lesions to the intertriginous areas of the bilateral thighs at the groin.  The erythematous rash shows overlying scale and excoriations.  No weeping, purulent drainage, or induration is appreciated. ____________________________________________  INITIAL IMPRESSION / ASSESSMENT AND PLAN / ED COURSE  DDX: contact dermatitis, rhus  dermatitis, impetigo, cutaneous candida, shingles,   Patient with ED evaluation of a chronic persistent rash for greater than 6 months.  Patient with a history of diabetes mellitus, presents with a rash consistent with likely cutaneous candidiasis.  She will be discharged with a prescription for clobetasol-betamethasone cream, and Diflucan to dose weekly x4.  She is advised to use an over-the-counter hypoallergenic soap and apply petroleum jelly over the topical cream.  She will follow-up with 1 of the local community clinics for ongoing management. ____________________________________________  FINAL CLINICAL IMPRESSION(S) / ED DIAGNOSES  Final diagnoses:  Intertriginous candidiasis      Carmie End, Dannielle Karvonen, PA-C 11/12/17 1735    Orbie Pyo, MD 11/12/17 2144

## 2018-04-02 ENCOUNTER — Emergency Department
Admission: EM | Admit: 2018-04-02 | Discharge: 2018-04-02 | Disposition: A | Discharge status: Home / Self Care | Payer: Self-pay | Attending: Emergency Medicine | Admitting: Emergency Medicine

## 2018-04-02 ENCOUNTER — Encounter: Payer: Self-pay | Admitting: Emergency Medicine

## 2018-04-02 ENCOUNTER — Other Ambulatory Visit: Payer: Self-pay

## 2018-04-02 DIAGNOSIS — R1012 Left upper quadrant pain: Secondary | ICD-10-CM | POA: Insufficient documentation

## 2018-04-02 DIAGNOSIS — Z7982 Long term (current) use of aspirin: Secondary | ICD-10-CM | POA: Insufficient documentation

## 2018-04-02 DIAGNOSIS — Z7984 Long term (current) use of oral hypoglycemic drugs: Secondary | ICD-10-CM | POA: Insufficient documentation

## 2018-04-02 DIAGNOSIS — E1165 Type 2 diabetes mellitus with hyperglycemia: Secondary | ICD-10-CM | POA: Insufficient documentation

## 2018-04-02 DIAGNOSIS — E785 Hyperlipidemia, unspecified: Secondary | ICD-10-CM | POA: Insufficient documentation

## 2018-04-02 DIAGNOSIS — R1084 Generalized abdominal pain: Secondary | ICD-10-CM

## 2018-04-02 DIAGNOSIS — J45909 Unspecified asthma, uncomplicated: Secondary | ICD-10-CM | POA: Insufficient documentation

## 2018-04-02 DIAGNOSIS — R739 Hyperglycemia, unspecified: Secondary | ICD-10-CM

## 2018-04-02 LAB — BASIC METABOLIC PANEL
ANION GAP: 9 (ref 5–15)
BUN: 18 mg/dL (ref 6–20)
CALCIUM: 8.8 mg/dL — AB (ref 8.9–10.3)
CO2: 23 mmol/L (ref 22–32)
Chloride: 101 mmol/L (ref 98–111)
Creatinine, Ser: 1.11 mg/dL — ABNORMAL HIGH (ref 0.44–1.00)
GFR, EST NON AFRICAN AMERICAN: 58 mL/min — AB (ref >60)
Glucose, Bld: 460 mg/dL — ABNORMAL HIGH (ref 70–99)
Potassium: 4.1 mmol/L (ref 3.5–5.1)
Sodium: 133 mmol/L — ABNORMAL LOW (ref 135–145)

## 2018-04-02 LAB — CBC
HCT: 37.8 % (ref 35.0–47.0)
Hemoglobin: 13 g/dL (ref 12.0–16.0)
MCH: 27.8 pg (ref 26.0–34.0)
MCHC: 34.4 g/dL (ref 32.0–36.0)
MCV: 80.7 fL (ref 80.0–100.0)
PLATELETS: 305 10*3/uL (ref 150–440)
RBC: 4.69 MIL/uL (ref 3.80–5.20)
RDW: 14 % (ref 11.5–14.5)
WBC: 6.8 10*3/uL (ref 3.6–11.0)

## 2018-04-02 LAB — URINALYSIS, COMPLETE (UACMP) WITH MICROSCOPIC
BACTERIA UA: NONE SEEN
Bilirubin Urine: NEGATIVE
Glucose, UA: >=500 mg/dL — AB
KETONES UR: NEGATIVE mg/dL
LEUKOCYTES UA: NEGATIVE
Nitrite: NEGATIVE
PH: 5 (ref 5.0–8.0)
PROTEIN: NEGATIVE mg/dL
Specific Gravity, Urine: 1.032 — ABNORMAL HIGH (ref 1.005–1.030)

## 2018-04-02 LAB — HEPATIC FUNCTION PANEL
ALBUMIN: 3.4 g/dL — AB (ref 3.5–5.0)
ALT: 15 U/L (ref 0–44)
AST: 18 U/L (ref 15–41)
Alkaline Phosphatase: 74 U/L (ref 38–126)
Total Bilirubin: 0.3 mg/dL (ref 0.3–1.2)
Total Protein: 6.8 g/dL (ref 6.5–8.1)

## 2018-04-02 LAB — LIPASE, BLOOD: LIPASE: 32 U/L (ref 11–51)

## 2018-04-02 LAB — POCT PREGNANCY, URINE: PREG TEST UR: NEGATIVE

## 2018-04-02 MED ORDER — METOCLOPRAMIDE HCL 10 MG PO TABS
20.0000 mg | ORAL_TABLET | Freq: Once | ORAL | Status: AC
Start: 2018-04-02 — End: 2018-04-02
  Administered 2018-04-02: 20 mg via ORAL
  Filled 2018-04-02: qty 2

## 2018-04-02 MED ORDER — GI COCKTAIL ~~LOC~~
30.0000 mL | Freq: Once | ORAL | Status: AC
Start: 2018-04-02 — End: 2018-04-02
  Administered 2018-04-02: 30 mL via ORAL
  Filled 2018-04-02: qty 30

## 2018-04-02 MED ORDER — METOCLOPRAMIDE HCL 10 MG PO TABS: 10.0000 mg | ORAL_TABLET | Freq: Four times a day (QID) | ORAL | 0 refills | Status: DC | PRN

## 2018-04-02 MED ORDER — PANTOPRAZOLE SODIUM 40 MG PO TBEC: 40.0000 mg | DELAYED_RELEASE_TABLET | Freq: Every day | ORAL | 1 refills | Status: DC

## 2018-04-02 MED ORDER — METFORMIN HCL 1000 MG PO TABS: 1000.0000 mg | ORAL_TABLET | Freq: Every day | ORAL | 2 refills | Status: DC

## 2018-04-02 MED ORDER — METFORMIN HCL 500 MG PO TABS
1000.0000 mg | ORAL_TABLET | Freq: Once | ORAL | Status: AC
  Administered 2018-04-02: 1000 mg via ORAL
  Filled 2018-04-02: qty 2

## 2018-04-02 NOTE — ED Provider Notes (Signed)
[facility] Emergency Department Provider Note  Time seen: 4:44 PM  I have reviewed the triage vital signs and the nursing notes.   HISTORY  Chief Complaint Flank Pain    HPI Wendy Frazier is a 47 y.o. female with a past medical history of diabetes, asthma, hyperlipidemia, presents to the emergency department with left upper quadrant pain.  According to the patient for the past 1 to 2 months she has had a constant left upper quadrant pain described as moderate aching type pain.  Patient went to [facility] 10 days ago for the same pain and had a work-up that was largely nonrevealing including a CT scan of the abdomen and pelvis.  Patient is diabetic has had high blood glucose at [facility] as well as in the emergency department today states she does not take any of her medications.  States she is prescribed metformin but does not take it.  Was referred to her primary care doctor at [facility] but will not see them for 3 weeks.  Patient denies any nausea or vomiting.  Denies diarrhea or fever.  No hematuria or dysuria.  States that times the pain is somewhat worse after eating.   Past Medical History:  Diagnosis Date   Asthma    Diabetes mellitus without complication ([place])    History of venomous spider bite 2015   Hospital inpatient tx for 1 week.   Hyperlipidemia    Migraines     Patient Active Problem List   Diagnosis Date Noted   Diabetes ([place]) 03/30/2015   Anemia 03/23/2015   Chest pain 07/28/2014    History reviewed. No pertinent surgical history.  Prior to Admission medications   Medication Sig Start Date End Date Taking? Authorizing Provider  albuterol (PROVENTIL HFA;VENTOLIN HFA) 108 (90 Base) MCG/ACT inhaler Inhale 2 puffs into the lungs every 6 (six) hours as needed for wheezing or shortness of breath. 07/24/17   Laban Emperor, PA-C  aspirin EC 325 MG tablet Take 1 tablet (325 mg total) by mouth daily. 08/28/17 08/28/18  Earleen Newport, MD   clotrimazole-betamethasone (LOTRISONE) cream Apply 1 application topically 2 (two) times daily. 11/12/17   Menshew, Dannielle Karvonen, PA-C  fluconazole (DIFLUCAN) 150 MG tablet Take 1 tablet (150 mg total) by mouth once a week. 11/12/17   Menshew, Dannielle Karvonen, PA-C  metFORMIN (GLUCOPHAGE) 500 MG tablet Take 1 tablet (500 mg total) by mouth 2 (two) times daily with a meal. 06/14/16 08/28/17  Johnn Hai, PA-C    Allergies  Allergen Reactions   Morphine And Related Other (See Comments)    confusion    Family History  Problem Relation Age of Onset   Cancer Mother        Breast   Diabetes Father    Aneurysm Father        Brain    Social History Social History   Tobacco Use   Smoking status: Never Smoker   Smokeless tobacco: Never Used  Substance Use Topics   Alcohol use: No   Drug use: No    Review of Systems Constitutional: Negative for fever. Cardiovascular: Negative for chest pain. Respiratory: Negative for shortness of breath. Gastrointestinal: Left upper quadrant abdominal pain, moderate, dull pain.  Negative nausea vomiting diarrhea Genitourinary: Negative for urinary compaints Musculoskeletal: Negative for musculoskeletal complaints Skin: Negative for skin complaints  Neurological: Negative for headache All other ROS negative  ____________________________________________   PHYSICAL EXAM:  VITAL SIGNS: ED Triage Vitals  Enc Vitals  Group     BP 04/02/18 1521 (!) 154/96     Pulse Rate 04/02/18 1521 76     Resp 04/02/18 1521 18     Temp 04/02/18 1521 98.1 F (36.7 C)     Temp Source 04/02/18 1521 Oral     SpO2 04/02/18 1521 98 %     Weight 04/02/18 1523 190 lb (86.2 kg)     Height 04/02/18 1523 5\' 4"  (1.626 m)     Head Circumference --      Peak Flow --      Pain Score 04/02/18 1523 8     Pain Loc --      Pain Edu? --      Excl. in [place]? --     Constitutional: Alert and oriented. Well appearing and in no distress. Eyes: Normal  exam ENT   Head: Normocephalic and atraumatic.   Mouth/Throat: Mucous membranes are moist. Cardiovascular: Normal rate, regular rhythm. No murmur Respiratory: Normal respiratory effort without tachypnea nor retractions. Breath sounds are clear Gastrointestinal: Soft, mild left upper quadrant tenderness, no rebound guarding or distention.  Abdomen otherwise benign. Musculoskeletal: Nontender with normal range of motion in all extremities.  Neurologic:  Normal speech and language. No gross focal neurologic deficits Skin:  Skin is warm, dry and intact.  Psychiatric: Mood and affect are normal.   ____________________________________________   INITIAL IMPRESSION / ASSESSMENT AND PLAN / ED COURSE  Pertinent labs & imaging results that were available during my care of the patient were reviewed by me and considered in my medical decision making (see chart for details).  Patient presents emergency department for left upper quadrant abdominal pain ongoing for greater than 1 month constant dull pain per patient.  Differential would include gastritis, gastric reflux, esophagitis, gastric or peptic ulcer disease, gastroparesis.  As the patient had a negative CT scan done 10 days ago with unchanged pain per patient I do not believe repeat CT imaging is warranted at this time due to radiation risk and likely little benefit.  Patient's lab work is reassuring including a normal white blood cell count.  Patient is hyperglycemic currently in the 400s which is similar to [facility] lab work performed 10 days ago.  Patient admits she does not take her metformin.  I had a long discussion with the patient regarding her diabetic management the need to take metformin twice daily every day regardless of how she is feeling.  Patient is agreeable and will start taking it.  We will refill the patient's metformin because she is not sure how much she has left.  I have added on hepatic function panel as well as a lipase  however the patient's tenderness is left upper quadrant.  Patient could very likely be suffering from gastritis versus gastroparesis.  As long as the remainder the patient's lab work is normal anticipate likely discharge home with metformin twice daily, Reglan as needed, and Protonix.  Patient will follow-up with a primary care doctor in October.  Remainder the patient's blood work is resulted normal.  We will discharge the patient with metformin, Protonix and Reglan.  I discussed with the patient the need to take her metformin twice daily she is agreeable.  She will follow-up with the primary care doctor as scheduled at [facility]. ____________________________________________   FINAL CLINICAL IMPRESSION(S) / ED DIAGNOSES  Left upper quadrant abdominal pain    Harvest Dark, MD 04/02/18 1752

## 2018-04-02 NOTE — ED Triage Notes (Signed)
Pt reports that she is having left flank pain that she went to [facility] (03/23/18) for they did a CT and found that she has swelling in right ovary, but she reports that it is not hurting there and usually hurts after she eats. She denies any trouble urinating. Denies any N/V/D she states it is just constant pain

## 2018-04-02 NOTE — ED Notes (Signed)
Lab notified to add on Lipase and Hepatic Function.

## 2018-04-12 IMAGING — MR MR HEAD W/O CM
10 series · 48 of 48 positions shown · non-contrast
Comparison: Head CT from earlier today

CLINICAL DATA: Left-sided face numbness beginning this morning.

EXAM:
MRI HEAD WITHOUT CONTRAST
TECHNIQUE: Multiplanar, multiecho pulse sequences of the brain and surrounding
structures were obtained without intravenous contrast.

[Series 2: T1 · sagittal · 5.0mm · 0.45mm/px · 2 of 25 slices shown (1 of 2)]
[im 1/25]
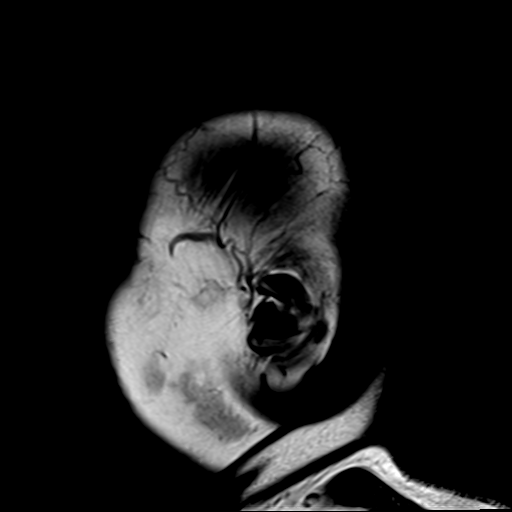
[im 25/25]
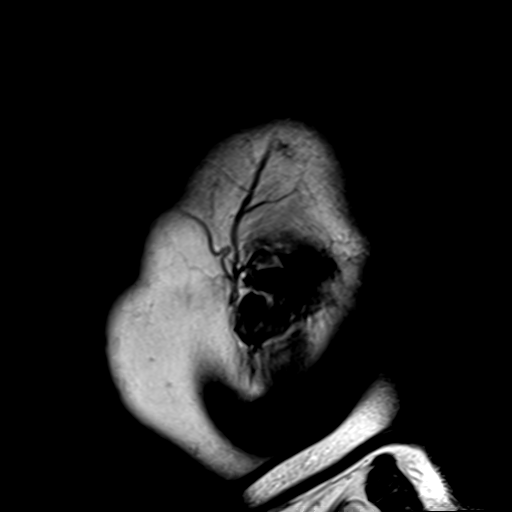

[Series 4: DWI · axial · 3.0mm · 1.80mm/px · z∈[-54,+108]mm · 5 of 55 slices shown (1 of 2)]
[im 1/55]
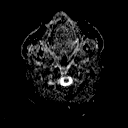
[im 14/55]
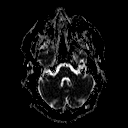
[im 28/55]
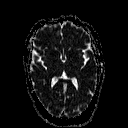
[im 41/55]
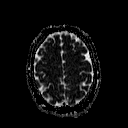
[im 55/55]
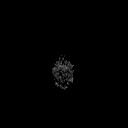

[Series 6: DWI · coronal · 3.0mm · 1.80mm/px · 4 of 45 slices shown (2 of 2)]
[im 1/45]
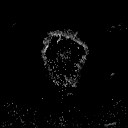
[im 15/45]
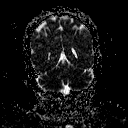
[im 30/45]
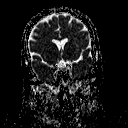
[im 45/45]
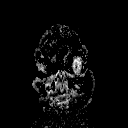

[Series 7: T2 · axial · 5.0mm · 0.60mm/px · z∈[-50,+105]mm · 2 of 25 slices shown (1 of 3)]
[im 1/25]
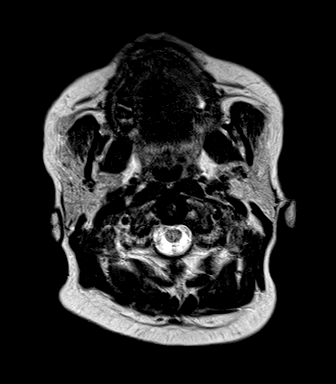
[im 25/25]
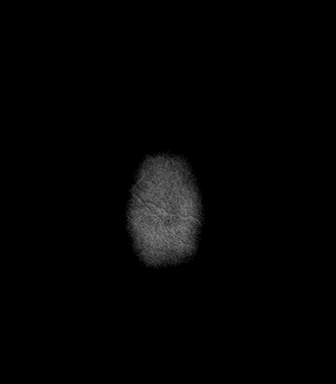

[Series 8: FLAIR · axial · 3.0mm · 0.45mm/px · z∈[-50,+105]mm · 5 of 53 slices shown]
[im 1/53]
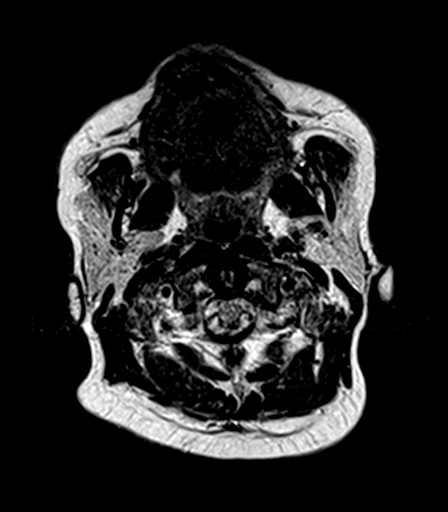
[im 14/53]
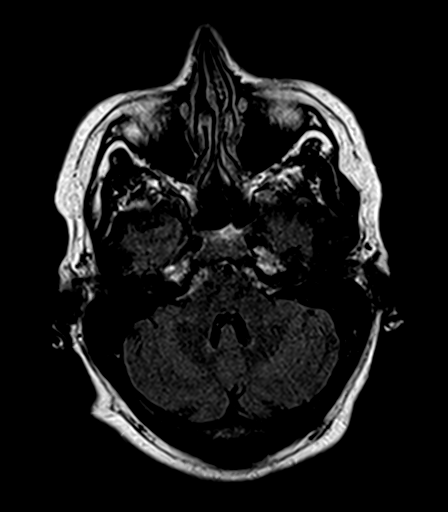
[im 27/53]
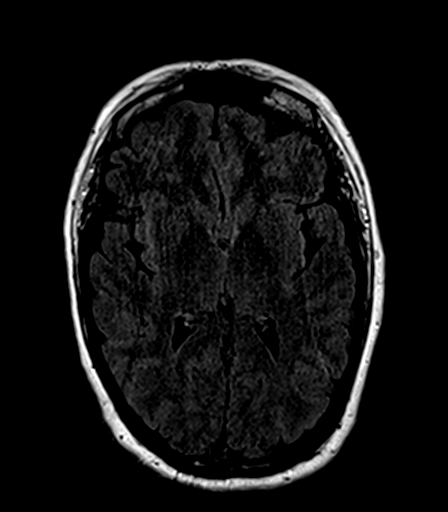
[im 40/53]
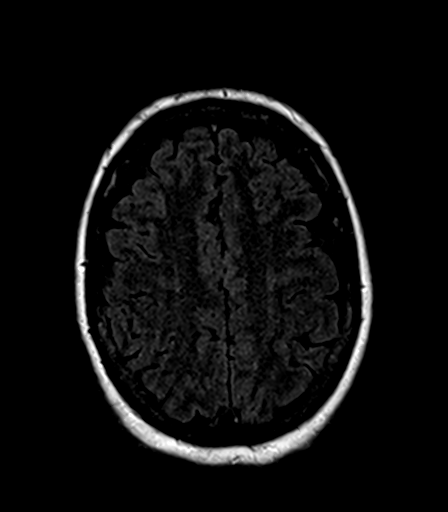
[im 53/53]
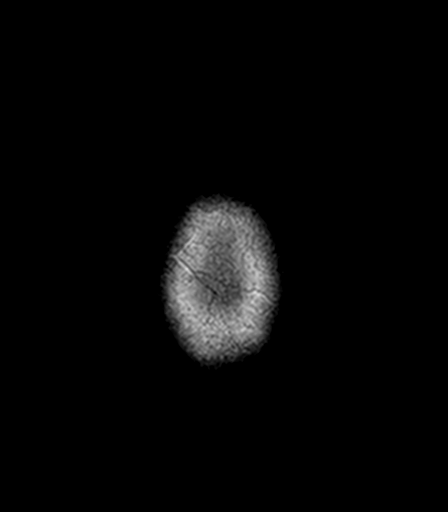

[Series 9: T2 · axial · 5.0mm · 0.45mm/px · z∈[-50,+105]mm · 2 of 25 slices shown (2 of 3)]
[im 1/25]
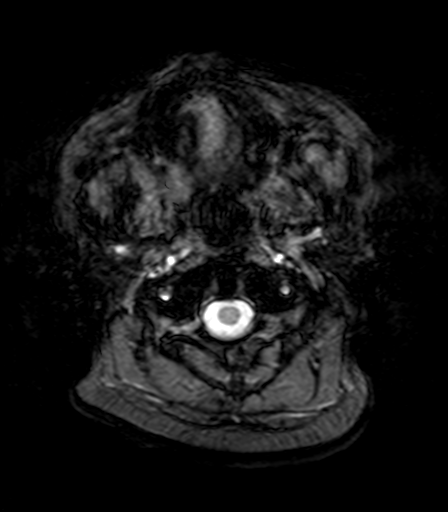
[im 25/25]
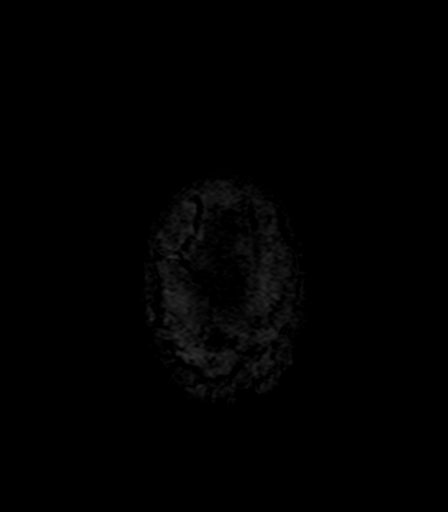

[Series 10: T1 · axial · 1.0mm · 1.00mm/px · z∈[-58,+116]mm · 16 of 176 slices shown (2 of 2)]
[im 1/176]
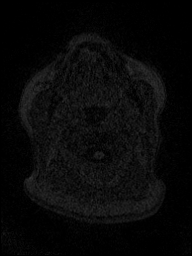
[im 12/176]
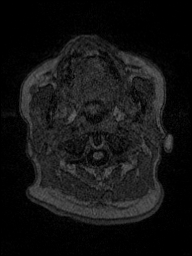
[im 24/176]
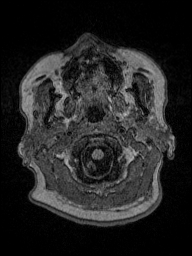
[im 36/176]
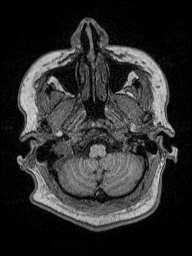
[im 47/176]
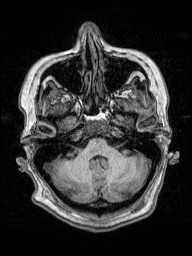
[im 59/176]
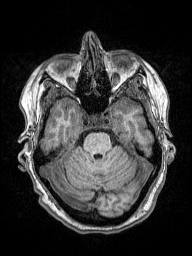
[im 71/176]
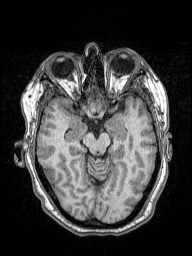
[im 82/176]
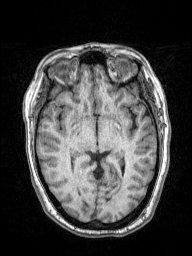
[im 94/176]
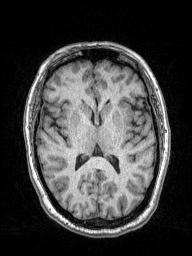
[im 106/176]
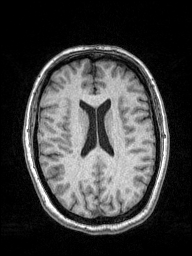
[im 117/176]
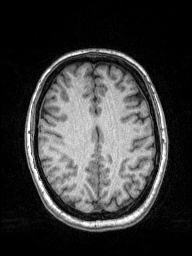
[im 129/176]
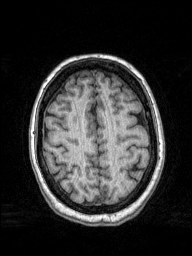
[im 141/176]
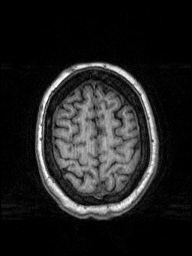
[im 152/176]
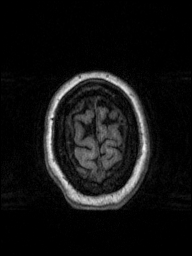
[im 164/176]
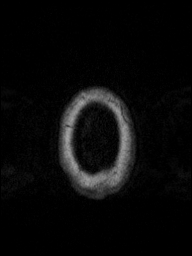
[im 176/176]
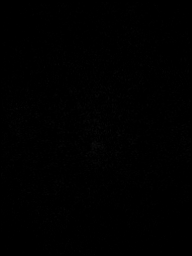

[Series 11: T2 · coronal · 5.0mm · 0.49mm/px · 3 of 29 slices shown (3 of 3)]
[im 1/29]
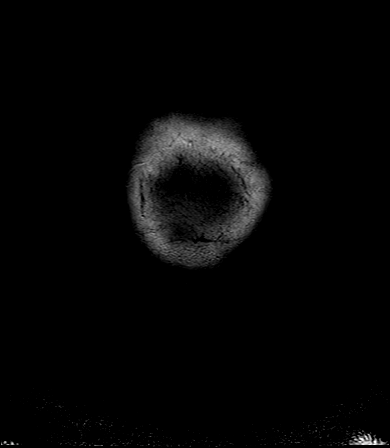
[im 15/29]
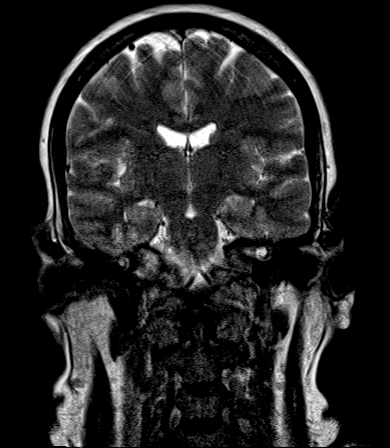
[im 29/29]
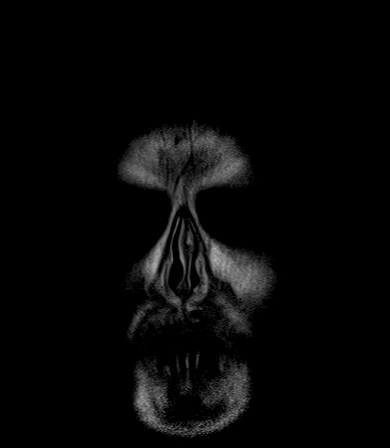

[Series 100: ax (id) · axial · 3.0mm · 1.80mm/px · z∈[-54,+108]mm · 5 of 55 slices shown]
[im 1/55]
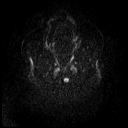
[im 14/55]
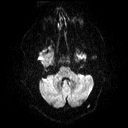
[im 28/55]
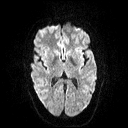
[im 41/55]
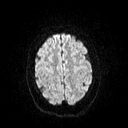
[im 55/55]
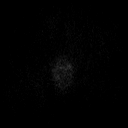

[Series 101: cor (id) · coronal · 3.0mm · 1.80mm/px · 4 of 43 slices shown]
[im 1/43]
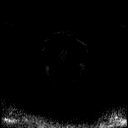
[im 15/43]
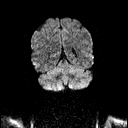
[im 29/43]
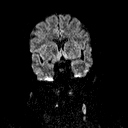
[im 43/43]
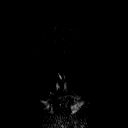

[48 of 48 positions shown; findings below may reference images not displayed]

FINDINGS: Brain: No infarction, hemorrhage, hydrocephalus, extra-axial
collection or mass lesion. No white matter disease or atrophy. No
abnormality noted along the trigeminal nerves, cisterns, or
brainstem.

Vascular: Normal flow voids.

Skull and upper cervical spine: Negative for marrow lesion

Sinuses/Orbits: Negative
IMPRESSION: Negative brain MRI.  No explanation for symptoms.

## 2018-05-20 ENCOUNTER — Other Ambulatory Visit: Payer: Self-pay

## 2018-05-20 ENCOUNTER — Inpatient Hospital Stay
Admission: AD | Admit: 2018-05-20 | Discharge: 2018-05-23 | DRG: 885 | Disposition: A | Discharge status: Home / Self Care | Payer: No Typology Code available for payment source | Source: Intra-hospital | Attending: Psychiatry | Admitting: Psychiatry

## 2018-05-20 ENCOUNTER — Encounter: Payer: Self-pay | Admitting: Emergency Medicine

## 2018-05-20 ENCOUNTER — Emergency Department
Admission: EM | Admit: 2018-05-20 | Discharge: 2018-05-20 | Disposition: A | Discharge status: Other Acute Inpatient Hospital | Payer: No Typology Code available for payment source | Attending: Emergency Medicine | Admitting: Emergency Medicine

## 2018-05-20 ENCOUNTER — Encounter: Payer: Self-pay | Admitting: Nurse Practitioner

## 2018-05-20 ENCOUNTER — Emergency Department: Payer: Self-pay

## 2018-05-20 DIAGNOSIS — Z7951 Long term (current) use of inhaled steroids: Secondary | ICD-10-CM

## 2018-05-20 DIAGNOSIS — F332 Major depressive disorder, recurrent severe without psychotic features: Secondary | ICD-10-CM | POA: Diagnosis present

## 2018-05-20 DIAGNOSIS — E785 Hyperlipidemia, unspecified: Secondary | ICD-10-CM | POA: Diagnosis present

## 2018-05-20 DIAGNOSIS — F322 Major depressive disorder, single episode, severe without psychotic features: Secondary | ICD-10-CM

## 2018-05-20 DIAGNOSIS — R45851 Suicidal ideations: Secondary | ICD-10-CM

## 2018-05-20 DIAGNOSIS — G4710 Hypersomnia, unspecified: Secondary | ICD-10-CM | POA: Diagnosis present

## 2018-05-20 DIAGNOSIS — Z7982 Long term (current) use of aspirin: Secondary | ICD-10-CM | POA: Diagnosis not present

## 2018-05-20 DIAGNOSIS — K219 Gastro-esophageal reflux disease without esophagitis: Secondary | ICD-10-CM | POA: Diagnosis present

## 2018-05-20 DIAGNOSIS — Z79899 Other long term (current) drug therapy: Secondary | ICD-10-CM | POA: Insufficient documentation

## 2018-05-20 DIAGNOSIS — J45909 Unspecified asthma, uncomplicated: Secondary | ICD-10-CM

## 2018-05-20 DIAGNOSIS — Z915 Personal history of self-harm: Secondary | ICD-10-CM

## 2018-05-20 DIAGNOSIS — E119 Type 2 diabetes mellitus without complications: Secondary | ICD-10-CM | POA: Diagnosis present

## 2018-05-20 DIAGNOSIS — Z7984 Long term (current) use of oral hypoglycemic drugs: Secondary | ICD-10-CM | POA: Insufficient documentation

## 2018-05-20 LAB — GLUCOSE, CAPILLARY
GLUCOSE-CAPILLARY: 346 mg/dL — AB (ref 70–99)
GLUCOSE-CAPILLARY: 232 mg/dL — AB (ref 70–99)

## 2018-05-20 LAB — URINALYSIS, COMPLETE (UACMP) WITH MICROSCOPIC
Bacteria, UA: NONE SEEN
Bilirubin Urine: NEGATIVE
Glucose, UA: >=500 mg/dL — AB
Hgb urine dipstick: NEGATIVE
Ketones, ur: NEGATIVE mg/dL
Leukocytes, UA: NEGATIVE
Nitrite: NEGATIVE
Protein, ur: NEGATIVE mg/dL
Specific Gravity, Urine: 1.025 (ref 1.005–1.030)
pH: 7 (ref 5.0–8.0)

## 2018-05-20 LAB — COMPREHENSIVE METABOLIC PANEL
ALT: 16 U/L (ref 0–44)
AST: 17 U/L (ref 15–41)
Albumin: 3.2 g/dL — ABNORMAL LOW (ref 3.5–5.0)
Alkaline Phosphatase: 81 U/L (ref 38–126)
Anion gap: 6 (ref 5–15)
BUN: 17 mg/dL (ref 6–20)
CO2: 27 mmol/L (ref 22–32)
Calcium: 8.1 mg/dL — ABNORMAL LOW (ref 8.9–10.3)
Chloride: 104 mmol/L (ref 98–111)
Creatinine, Ser: 0.96 mg/dL (ref 0.44–1.00)
GFR calc Af Amer: >60 mL/min (ref >60)
GFR calc non Af Amer: >60 mL/min (ref >60)
Glucose, Bld: 346 mg/dL — ABNORMAL HIGH (ref 70–99)
Potassium: 3.9 mmol/L (ref 3.5–5.1)
Sodium: 137 mmol/L (ref 135–145)
Total Bilirubin: 0.6 mg/dL (ref 0.3–1.2)
Total Protein: 7.1 g/dL (ref 6.5–8.1)

## 2018-05-20 LAB — URINE DRUG SCREEN, QUALITATIVE (ARMC ONLY)
Amphetamines, Ur Screen: NOT DETECTED
Barbiturates, Ur Screen: NOT DETECTED
Benzodiazepine, Ur Scrn: POSITIVE — AB
Cannabinoid 50 Ng, Ur ~~LOC~~: NOT DETECTED
Cocaine Metabolite,Ur ~~LOC~~: NOT DETECTED
MDMA (Ecstasy)Ur Screen: NOT DETECTED
Methadone Scn, Ur: NOT DETECTED
Opiate, Ur Screen: NOT DETECTED
Phencyclidine (PCP) Ur S: NOT DETECTED
Tricyclic, Ur Screen: NOT DETECTED

## 2018-05-20 LAB — CBC
HCT: 38.3 % (ref 36.0–46.0)
Hemoglobin: 12.9 g/dL (ref 12.0–15.0)
MCH: 27.2 pg (ref 26.0–34.0)
MCHC: 33.7 g/dL (ref 30.0–36.0)
MCV: 80.6 fL (ref 80.0–100.0)
Platelets: 287 10*3/uL (ref 150–400)
RBC: 4.75 MIL/uL (ref 3.87–5.11)
RDW: 12.6 % (ref 11.5–15.5)
WBC: 7.1 10*3/uL (ref 4.0–10.5)
nRBC: 0 % (ref 0.0–0.2)

## 2018-05-20 LAB — ACETAMINOPHEN LEVEL: Acetaminophen (Tylenol), Serum: <10 ug/mL — ABNORMAL LOW (ref 10–30)

## 2018-05-20 LAB — POCT PREGNANCY, URINE: Preg Test, Ur: NEGATIVE

## 2018-05-20 LAB — SALICYLATE LEVEL: Salicylate Lvl: <7 mg/dL (ref 2.8–30.0)

## 2018-05-20 LAB — ETHANOL: Alcohol, Ethyl (B): <10 mg/dL (ref <10)

## 2018-05-20 LAB — PREGNANCY, URINE: Preg Test, Ur: NEGATIVE

## 2018-05-20 MED ORDER — HYDROXYZINE HCL 50 MG PO TABS
50.0000 mg | ORAL_TABLET | Freq: Three times a day (TID) | ORAL | Status: DC | PRN
  Administered 2018-05-23: 50 mg via ORAL
  Filled 2018-05-20: qty 1

## 2018-05-20 MED ORDER — METOPROLOL TARTRATE 25 MG PO TABS: 12.5000 mg | ORAL_TABLET | Freq: Two times a day (BID) | ORAL | Status: DC

## 2018-05-20 MED ORDER — TRAZODONE HCL 100 MG PO TABS
100.0000 mg | ORAL_TABLET | Freq: Every evening | ORAL | Status: DC | PRN
  Administered 2018-05-20 – 2018-05-22 (×2): 100 mg via ORAL
  Filled 2018-05-20 (×2): qty 1

## 2018-05-20 MED ORDER — ACETAMINOPHEN 325 MG PO TABS
650.0000 mg | ORAL_TABLET | Freq: Four times a day (QID) | ORAL | Status: DC | PRN
  Administered 2018-05-21 (×2): 650 mg via ORAL
  Filled 2018-05-20 (×2): qty 2

## 2018-05-20 MED ORDER — ALBUTEROL SULFATE HFA 108 (90 BASE) MCG/ACT IN AERS
2.0000 | INHALATION_SPRAY | Freq: Four times a day (QID) | RESPIRATORY_TRACT | Status: DC | PRN
  Filled 2018-05-20: qty 6.7

## 2018-05-20 MED ORDER — ALUM & MAG HYDROXIDE-SIMETH 200-200-20 MG/5ML PO SUSP: 30.0000 mL | ORAL | Status: DC | PRN

## 2018-05-20 MED ORDER — METFORMIN HCL 500 MG PO TABS
1000.0000 mg | ORAL_TABLET | Freq: Two times a day (BID) | ORAL | Status: DC
  Administered 2018-05-21 – 2018-05-23 (×5): 1000 mg via ORAL
  Filled 2018-05-20 (×5): qty 2

## 2018-05-20 MED ORDER — INSULIN ASPART 100 UNIT/ML ~~LOC~~ SOLN
0.0000 [IU] | Freq: Three times a day (TID) | SUBCUTANEOUS | Status: DC
  Administered 2018-05-20: 11 [IU] via SUBCUTANEOUS
  Filled 2018-05-20: qty 1

## 2018-05-20 MED ORDER — METOPROLOL TARTRATE 25 MG PO TABS
12.5000 mg | ORAL_TABLET | Freq: Two times a day (BID) | ORAL | Status: DC
  Administered 2018-05-21 – 2018-05-22 (×4): 12.5 mg via ORAL
  Filled 2018-05-20 (×4): qty 1

## 2018-05-20 MED ORDER — INSULIN ASPART 100 UNIT/ML ~~LOC~~ SOLN
0.0000 [IU] | Freq: Three times a day (TID) | SUBCUTANEOUS | Status: DC
  Administered 2018-05-21: 3 [IU] via SUBCUTANEOUS
  Administered 2018-05-21: 5 [IU] via SUBCUTANEOUS
  Administered 2018-05-21: 11 [IU] via SUBCUTANEOUS
  Administered 2018-05-22 (×2): 5 [IU] via SUBCUTANEOUS
  Administered 2018-05-23: 8 [IU] via SUBCUTANEOUS
  Filled 2018-05-20 (×3): qty 1

## 2018-05-20 MED ORDER — MAGNESIUM HYDROXIDE 400 MG/5ML PO SUSP: 30.0000 mL | Freq: Every day | ORAL | Status: DC | PRN

## 2018-05-20 MED ORDER — METFORMIN HCL 500 MG PO TABS
1000.0000 mg | ORAL_TABLET | Freq: Two times a day (BID) | ORAL | Status: DC
  Administered 2018-05-20: 1000 mg via ORAL
  Filled 2018-05-20: qty 2

## 2018-05-20 MED ORDER — ALBUTEROL SULFATE (2.5 MG/3ML) 0.083% IN NEBU: 2.5000 mg | INHALATION_SOLUTION | Freq: Four times a day (QID) | RESPIRATORY_TRACT | Status: DC | PRN

## 2018-05-20 NOTE — ED Provider Notes (Signed)
[facility] Emergency Department Provider Note   ____________________________________________   First MD Initiated Contact with Patient 05/20/18 1024     (approximate)  I have reviewed the triage vital signs and the nursing notes.   HISTORY  Chief Complaint No chief complaint on file. Chief complaint is depression  HPI Wendy Frazier is a 47 y.o. female who comes in under commitment from [place].  Paper say that she is been depressed for some time and was thinking of stepping in front of a train.  Patient says [facility] does not care about her and did not want to see her today because she peed on herself yesterday because she does not have good bladder control.  This is a chronic problem.  Patient says her only other medical complaint is that she has pain on the left side of her chest under the ribs is been there for months and nobody can figure it out.  Past medical history is as noted below.  I should add she does not have any rash in the area of pain.  Patient was tearful and crying on arrival here is slightly better now.   Past Medical History:  Diagnosis Date   Asthma    Diabetes mellitus without complication ([place])    History of venomous spider bite 2015   Hospital inpatient tx for 1 week.   Hyperlipidemia    Migraines     Patient Active Problem List   Diagnosis Date Noted   Diabetes ([place]) 03/30/2015   Anemia 03/23/2015   Chest pain 07/28/2014    History reviewed. No pertinent surgical history.  Prior to Admission medications   Medication Sig Start Date End Date Taking? Authorizing Provider  albuterol (PROVENTIL HFA;VENTOLIN HFA) 108 (90 Base) MCG/ACT inhaler Inhale 2 puffs into the lungs every 6 (six) hours as needed for wheezing or shortness of breath. 07/24/17   Laban Emperor, PA-C  aspirin EC 325 MG tablet Take 1 tablet (325 mg total) by mouth daily. 08/28/17 08/28/18  Earleen Newport, MD  clotrimazole-betamethasone (LOTRISONE) cream Apply 1  application topically 2 (two) times daily. 11/12/17   Menshew, Dannielle Karvonen, PA-C  fluconazole (DIFLUCAN) 150 MG tablet Take 1 tablet (150 mg total) by mouth once a week. 11/12/17   Menshew, Dannielle Karvonen, PA-C  metFORMIN (GLUCOPHAGE) 1000 MG tablet Take 1 tablet (1,000 mg total) by mouth daily with breakfast. 04/02/18 07/01/18  Harvest Dark, MD  metoCLOPramide (REGLAN) 10 MG tablet Take 1 tablet (10 mg total) by mouth every 6 (six) hours as needed for nausea. 04/02/18   Harvest Dark, MD  pantoprazole (PROTONIX) 40 MG tablet Take 1 tablet (40 mg total) by mouth daily. 04/02/18 04/02/19  Harvest Dark, MD    Allergies Morphine and related  Family History  Problem Relation Age of Onset   Cancer Mother        Breast   Diabetes Father    Aneurysm Father        Brain    Social History Social History   Tobacco Use   Smoking status: Never Smoker   Smokeless tobacco: Never Used  Substance Use Topics   Alcohol use: No   Drug use: No    Review of Systems  Constitutional: No fever/chills Eyes: No visual changes. ENT: No sore throat. Cardiovascular: Denies chest pain. Respiratory: Denies shortness of breath. Gastrointestinal: No abdominal pain.  No nausea, no vomiting.  No diarrhea.  No constipation. Genitourinary: Negative for dysuria. Musculoskeletal: Negative for back  pain. Skin: Negative for rash. Neurological: Negative for headaches, focal weakness   ____________________________________________   PHYSICAL EXAM:  VITAL SIGNS: ED Triage Vitals [05/20/18 1041]  Enc Vitals Group     BP      Pulse      Resp      Temp      Temp src      SpO2      Weight      Height      Head Circumference      Peak Flow      Pain Score 8     Pain Loc      Pain Edu?      Excl. in [place]?     Constitutional: Alert and oriented. Well appearing and in no acute distress. Eyes: Conjunctivae are normal.  Head: Atraumatic. Nose: No  congestion/rhinnorhea. Mouth/Throat: Mucous membranes are moist.  Oropharynx non-erythematous. Neck: No stridor.  Cardiovascular: Normal rate, regular rhythm. Grossly normal heart sounds.  Good peripheral circulation. Respiratory: Normal respiratory effort.  No retractions. Lungs CTAB.  There is tenderness to palpation along the lower ribs on the left side of the patient's chest.  This exactly reproduces her pain Gastrointestinal: Soft and nontender. No distention. No abdominal bruits. No CVA tenderness. Musculoskeletal: No lower extremity tenderness nor edema.  No joint effusions. Neurologic:  Normal speech and language. No gross focal neurologic deficits are appreciated.  Skin:  Skin is warm, dry and intact. No rash noted. Psychiatric: Mood and affect are normal. Speech and behavior are normal.  ____________________________________________   LABS (all labs ordered are listed, but only abnormal results are displayed)  Labs Reviewed  COMPREHENSIVE METABOLIC PANEL  ETHANOL  SALICYLATE LEVEL  ACETAMINOPHEN LEVEL  CBC  URINE DRUG SCREEN, QUALITATIVE ([place])  POC URINE PREG, ED   ____________________________________________  EKG   ____________________________________________  RADIOLOGY  ED MD interpretation:   Official radiology report(s): No results found.  ____________________________________________   PROCEDURES  Procedure(s) performed:   Procedures  Critical Care performed:   ____________________________________________   INITIAL IMPRESSION / ASSESSMENT AND PLAN / ED COURSE           ____________________________________________   FINAL CLINICAL IMPRESSION(S) / ED DIAGNOSES  Final diagnoses:  Current severe episode of major depressive disorder without psychotic features, unspecified whether recurrent [facility])     ED Discharge Orders    None       Note:  This document was prepared using Dragon voice recognition software and may include  unintentional dictation errors.    Nena Polio, MD 05/20/18 1626

## 2018-05-20 NOTE — ED Notes (Addendum)
Pt verbalizes  "I am not use to anybody - nobody cares about me  - I went to [facility] yesterday for help but I peed on myself and then they would not talk to me  - I begged them to talk to me but they made me leave  I live with my sister  - I slept good last night but I got woke up by the police this morning.  I don't want to be here.  My boyfriend left and I have heard from him in over a week"  Plan of care discussed  Reassurance provided

## 2018-05-20 NOTE — ED Notes (Signed)
Patient dressed out by this Tech, Vet (1) Pair of yellow sandals (1) Pair of Pink under wear (1) Pair of grey pants (1) Pink shirt (1) White Bra (1) Black Cell phone Car keys (Car keys and cell phone placed in Pt's belonging bag)
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
## 2018-05-20 NOTE — ED Notes (Signed)
[facility]  PENDING  PLACEMENT

## 2018-05-20 NOTE — BH Assessment (Signed)
Patient is to be admitted to [facility] by Dr. Weber Cooks.  Attending Physician will be Dr. Bary Leriche.   Patient has been assigned to room 324, by [facility] Charge Nurse [place].   ER staff is aware of the admission:  Lattie Haw, ER Secretary    Dr. Cinda Quest, ER MD   Donneta Romberg, Patient's Nurse   Sharyn Lull, Patient Access.

## 2018-05-20 NOTE — Progress Notes (Signed)
Patient ID: Wendy Frazier, female   DOB: [birth year], 47 y.o.   MRN: [id number] Pt was admitted to the unit involuntarily after experiencing increased depression and expressing suicidal ideations with a plan of stepping in front of a moving train. She stated that her boyfriend left her and ran away and she now has "nobody". Pt lives with her sister but does not have much support from her. Per nursing report, pt went to  [facility] for her regular appointment and told the provider that she was going to harm herself, that nobody likes her, and that people think she is stupid. Upon assessment, pt is sad and depressed, anxious, and expressing hopelessness. However, pt currently denies suicidal thoughts and contracts for safety. Skin assessment performed by this Probation officer, assisted by [employer]. No skin issues noted. CBG = 232 at bedtime. Pt is admitted and oriented to the unit, and safety precautions initiated. Food and beverages provided. Pt requested trazadone for sleep. Will be evaluated by attending provider in am.

## 2018-05-20 NOTE — Tx Team (Signed)
Initial Treatment Plan 05/20/2018 10:53 PM Wendy Frazier EGB:[id number]    PATIENT STRESSORS: Health problems Loss of Relationship Medication change or noncompliance   PATIENT STRENGTHS: Ability for insight Communication skills Supportive family/friends   PATIENT IDENTIFIED PROBLEMS: Depression  Anxiety  Suicidal thoughts                 DISCHARGE CRITERIA:  Ability to meet basic life and health needs Improved stabilization in mood, thinking, and/or behavior Medical problems require only outpatient monitoring Motivation to continue treatment in a less acute level of care  PRELIMINARY DISCHARGE PLAN: Attend aftercare/continuing care group Outpatient therapy Return to previous living arrangement  PATIENT/FAMILY INVOLVEMENT: This treatment plan has been presented to and reviewed with the patient, Wendy Frazier. The patient has been given the opportunity to ask questions and make suggestions.  Randon Goldsmith, RN 05/20/2018, 10:53 PM

## 2018-05-20 NOTE — BH Assessment (Signed)
Assessment Note  Wendy Frazier is an 47 y.o. female who presents to the ER after she was seen at [facility]. Patient was sent to [facility] because from her PCP's office due to concerns about her safety, based on what she was saying during the appointment. According to the patient, when she was at [facility], she used the bathroom on herself and was asked to leave. Then law enforcement picked her up from her home and brought her to the ER because she was placed under IVC. Per [facility], when she was at their office, she became upset when they asked her if she had used the restroom on herself. She walked out of the office and would not return their phone calls, thus they petitioned for her to be under IVC.  Patient reports of having racing thoughts and thoughts of ending her life. When asked if she had a plan, she initially said no. She eventually shared she was having thoughts of overdosing on medications.  During the interview, several times the patient's answers did not go with the questions. When asked to repeat herself she would give another answer that was more appropriate. When this happened, she would become frustrated and state, "I just said that, you won't listening."   Diagnosis: Depression  Past Medical History:  Past Medical History:  Diagnosis Date  . Asthma   . Diabetes mellitus without complication (HCC)   . History of venomous spider bite 2015   Hospital inpatient tx for 1 week.  . Hyperlipidemia   . Migraines     History reviewed. No pertinent surgical history.  Family History:  Family History  Problem Relation Age of Onset  . Cancer Mother        Breast  . Diabetes Father   . Aneurysm Father        Brain    Social History:  reports that she has never smoked. She has never used smokeless tobacco. She reports that she does not drink alcohol or use drugs.  Additional Social History:  Alcohol / Drug Use Pain Medications: See PTA Prescriptions: See PTA Over the Counter: See PTA History of  alcohol / drug use?: No history of alcohol / drug abuse Longest period of sobriety (when/how long): See PTA Negative Consequences of Use: (n/a) Withdrawal Symptoms: (n/a)  CIWA: CIWA-Ar BP: (!) 144/87 Pulse Rate: 86 COWS:    Allergies:  Allergies  Allergen Reactions  . Morphine And Related Other (See Comments)    confusion    Home Medications:  (Not in a hospital admission)  OB/GYN Status:  No LMP recorded.  General Assessment Data Location of Assessment: [facility] ED TTS Assessment: In system Is this a Tele or Face-to-Face Assessment?: Face-to-Face Is this an Initial Assessment or a Re-assessment for this encounter?: Initial Assessment Language Other than English: No Living Arrangements: Other (Comment)(Private home) What gender do you identify as?: Female Marital status: Single Pregnancy Status: No Living Arrangements: Other relatives Can pt return to current living arrangement?: Yes Admission Status: Involuntary Petitioner: Other Is patient capable of signing voluntary admission?: No(Under IVC) Referral Source: Self/Family/Friend Insurance type: None  Medical Screening Exam [facility] Walk-in ONLY) Medical Exam completed: Yes  Crisis Care Plan Living Arrangements: Other relatives Name of Psychiatrist: Reports of none Name of Therapist: Reports of none  Education Status Is patient currently in school?: No Is the patient employed, unemployed or receiving disability?: Unemployed  Risk to self with the past 6 months Suicidal Ideation: Yes-Currently Present Has patient been a risk to  self within the past 6 months prior to admission? : Yes Suicidal Intent: No Has patient had any suicidal intent within the past 6 months prior to admission? : No Is patient at risk for suicide?: Yes Suicidal Plan?: Yes-Currently Present Has patient had any suicidal plan within the past 6 months prior to admission? : Yes Specify Current Suicidal Plan: Overdose on medication Access to Means:  No What has been your use of drugs/alcohol within the last 12 months?: Reports of none Previous Attempts/Gestures: No How many times?: 0 Other Self Harm Risks: Reports of none Triggers for Past Attempts: None known Intentional Self Injurious Behavior: None Family Suicide History: No Recent stressful life event(s): Other (Comment), Loss (Comment), Conflict (Comment) Persecutory voices/beliefs?: No Depression: Yes Depression Symptoms: Insomnia, Isolating, Loss of interest in usual pleasures, Feeling worthless/self pity, Feeling angry/irritable Substance abuse history and/or treatment for substance abuse?: No Suicide prevention information given to non-admitted patients: Not applicable  Risk to Others within the past 6 months Homicidal Ideation: No Does patient have any lifetime risk of violence toward others beyond the six months prior to admission? : No Thoughts of Harm to Others: No Current Homicidal Intent: No Current Homicidal Plan: No Access to Homicidal Means: No Identified Victim: Reports of none History of harm to others?: No Assessment of Violence: None Noted Violent Behavior Description: Reports of none Does patient have access to weapons?: No Criminal Charges Pending?: No Does patient have a court date: No Is patient on probation?: No  Psychosis Hallucinations: Auditory, Visual Delusions: None noted  Mental Status Report Appearance/Hygiene: Unremarkable Eye Contact: Good Motor Activity: Freedom of movement, Unremarkable Speech: Logical/coherent, Unremarkable Level of Consciousness: Alert Mood: Anxious, Depressed, Helpless, Sad, Pleasant Affect: Appropriate to circumstance, Depressed, Sad Anxiety Level: Minimal Thought Processes: Coherent, Relevant Judgement: Partial Orientation: Person, Place, Time, Situation, Appropriate for developmental age Obsessive Compulsive Thoughts/Behaviors: Minimal  Cognitive Functioning Concentration: Normal Memory: Recent  Intact, Remote Intact Is patient IDD: No Insight: Fair Impulse Control: Fair Appetite: Good Have you had any weight changes? : No Change Sleep: No Change Total Hours of Sleep: 8 Vegetative Symptoms: None  ADLScreening [facility] Assessment Services) Patient's cognitive ability adequate to safely complete daily activities?: Yes Patient able to express need for assistance with ADLs?: Yes Independently performs ADLs?: Yes (appropriate for developmental age)  Prior Inpatient Therapy Prior Inpatient Therapy: Yes Prior Therapy Dates: Patient unable to remember the dates Prior Therapy Facilty/Provider(s): [facility] Reason for Treatment: "I had to take a test"  Prior Outpatient Therapy Prior Outpatient Therapy: No  ADL Screening (condition at time of admission) Patient's cognitive ability adequate to safely complete daily activities?: Yes Is the patient deaf or have difficulty hearing?: No Does the patient have difficulty seeing, even when wearing glasses/contacts?: No Does the patient have difficulty concentrating, remembering, or making decisions?: No Patient able to express need for assistance with ADLs?: Yes Does the patient have difficulty dressing or bathing?: No Independently performs ADLs?: Yes (appropriate for developmental age) Does the patient have difficulty walking or climbing stairs?: No Weakness of Legs: None Weakness of Arms/Hands: None  Home Assistive Devices/Equipment Home Assistive Devices/Equipment: None  Therapy Consults (therapy consults require a physician order) PT Evaluation Needed: No OT Evalulation Needed: No SLP Evaluation Needed: No Abuse/Neglect Assessment (Assessment to be complete while patient is alone) Abuse/Neglect Assessment Can Be Completed: Yes Physical Abuse: Denies Verbal Abuse: Denies Sexual Abuse: Denies Exploitation of patient/patient's resources: Denies Self-Neglect: Denies Values / Beliefs Cultural Requests During  Hospitalization: None Spiritual Requests  During Hospitalization: None Consults Spiritual Care Consult Needed: No Social Work Consult Needed: No         Child/Adolescent Assessment Running Away Risk: Denies(Patient is an adult)  Disposition:  Disposition Initial Assessment Completed for this Encounter: Yes  On Site Evaluation by:   Reviewed with Physician:    Lilyan Gilford MS, LCAS, LPC, NCC, CCSI Therapeutic Triage Specialist 05/20/2018 7:22 PM

## 2018-05-20 NOTE — ED Notes (Signed)
BEHAVIORAL HEALTH ROUNDING Patient sleeping: No. Patient alert and oriented: yes Behavior appropriate: Yes.  ; If no, describe:  Nutrition and fluids offered: yes Toileting and hygiene offered: Yes  Sitter present: q15 minute observations and security monitoring Law enforcement present: Yes  ODS

## 2018-05-20 NOTE — ED Notes (Signed)
Report to include Situation, Background, Assessment, and Recommendations received from [facility]. Patient alert and oriented, warm and dry, in no acute distress. Patient denies SI, HI, AVH and pain. Patient made aware of Q15 minute rounds and security cameras for their safety. Patient instructed to come to me with needs or concerns.

## 2018-05-20 NOTE — ED Notes (Signed)
Patient assigned to appropriate care area   Introduced self to pt  Patient oriented to unit/care area: Informed that, for their safety, care areas are designed for safety and visiting and phone hours explained to patient. Patient verbalizes understanding, and verbal contract for safety obtained Environment secured.  Writer asked patient if she wanted to hurt herself or anyone else patient responded by saying "I dont give a shit". Writer asked patient to repeat and she turned over.

## 2018-05-20 NOTE — ED Notes (Signed)
Pt escorted to and from x-ray by this tech and [employer].

## 2018-05-20 NOTE — Plan of Care (Signed)
Pt is newly admitted, but is adjusting well to the unit.

## 2018-05-20 NOTE — ED Notes (Signed)
Hourly rounding reveals patient in room. No complaints, stable, in no acute distress. Q15 minute rounds and monitoring via [employer] to continue.

## 2018-05-20 NOTE — ED Notes (Signed)
BEHAVIORAL HEALTH ROUNDING Patient sleeping: No. Patient alert and oriented: yes Behavior appropriate: Yes.  ; If no, describe:  Nutrition and fluids offered: yes Toileting and hygiene offered: Yes  Sitter present: q15 minute observations and security monitoring Law enforcement present: Yes  ODS  ENVIRONMENTAL ASSESSMENT Potentially harmful objects out of patient reach: Yes.   Personal belongings secured: Yes.   Patient dressed in hospital provided attire only: Yes.   Plastic bags out of patient reach: Yes.   Patient care equipment (cords, cables, call bells, lines, and drains) shortened, removed, or accounted for: Yes.   Equipment and supplies removed from bottom of stretcher: Yes.   Potentially toxic materials out of patient reach: Yes.   Sharps container removed or out of patient reach: Yes.

## 2018-05-20 NOTE — ED Notes (Signed)
BEHAVIORAL HEALTH ROUNDING Patient sleeping: Yes.   Patient alert and oriented: eyes closed  Appears to be asleep Behavior appropriate: Yes.  ; If no, describe:  Nutrition and fluids offered: Yes  Toileting and hygiene offered: sleeping Sitter present: q 15 minute observations and security monitoring Law enforcement present: yes  ODS

## 2018-05-21 DIAGNOSIS — F332 Major depressive disorder, recurrent severe without psychotic features: Principal | ICD-10-CM

## 2018-05-21 LAB — GLUCOSE, CAPILLARY
GLUCOSE-CAPILLARY: 312 mg/dL — AB (ref 70–99)
GLUCOSE-CAPILLARY: 198 mg/dL — AB (ref 70–99)
Glucose-Capillary: 229 mg/dL — ABNORMAL HIGH (ref 70–99)
Glucose-Capillary: 160 mg/dL — ABNORMAL HIGH (ref 70–99)

## 2018-05-21 LAB — HEMOGLOBIN A1C
Hgb A1c MFr Bld: 11.8 % — ABNORMAL HIGH (ref 4.8–5.6)
MEAN PLASMA GLUCOSE: 291.96 mg/dL

## 2018-05-21 LAB — LIPID PANEL
Cholesterol: 215 mg/dL — ABNORMAL HIGH (ref 0–200)
HDL: 43 mg/dL (ref >40)
LDL CALC: 135 mg/dL — AB (ref 0–99)
Total CHOL/HDL Ratio: 5 RATIO
Triglycerides: 187 mg/dL — ABNORMAL HIGH (ref <150)
VLDL: 37 mg/dL (ref 0–40)

## 2018-05-21 LAB — TSH: TSH: 1.48 u[IU]/mL (ref 0.350–4.500)

## 2018-05-21 MED ORDER — SERTRALINE HCL 25 MG PO TABS
50.0000 mg | ORAL_TABLET | Freq: Every day | ORAL | Status: DC
  Administered 2018-05-21 – 2018-05-23 (×3): 50 mg via ORAL
  Filled 2018-05-21 (×3): qty 2

## 2018-05-21 MED ORDER — INSULIN GLARGINE 100 UNIT/ML ~~LOC~~ SOLN
16.0000 [IU] | Freq: Every day | SUBCUTANEOUS | Status: DC
  Administered 2018-05-21 – 2018-05-22 (×2): 16 [IU] via SUBCUTANEOUS
  Filled 2018-05-21 (×2): qty 0.16

## 2018-05-21 NOTE — [facility] Group Notes (Signed)
LCSW Group Therapy Note  05/21/2018 1:00 pm  Type of Therapy/Topic:  Group Therapy:  Emotion Regulation  Participation Level:  Minimal   Description of Group:    The purpose of this group is to assist patients in learning to regulate negative emotions and experience positive emotions. Patients will be guided to discuss ways in which they have been vulnerable to their negative emotions. These vulnerabilities will be juxtaposed with experiences of positive emotions or situations, and patients will be challenged to use positive emotions to combat negative ones. Special emphasis will be placed on coping with negative emotions in conflict situations, and patients will process healthy conflict resolution skills.  Therapeutic Goals: 1. Patient will identify two positive emotions or experiences to reflect on in order to balance out negative emotions 2. Patient will label two or more emotions that they find the most difficult to experience 3. Patient will demonstrate positive conflict resolution skills through discussion and/or role plays  Summary of Patient Progress: Tiersa was able to participate some in today's group discussion on emotion regulation.  Darian shared that betrayal and lost (of an intimate relationship) has been the most difficult emotion and experience that she has had to deal with.      Therapeutic Modalities:   Cognitive Behavioral Therapy Feelings Identification Dialectical Behavioral Therapy

## 2018-05-21 NOTE — [facility] Group Notes (Signed)
[place] Group Notes:  (Nursing/MHT/Case Management/Adjunct)  Date:  05/21/2018  Time:  9:46 PM  Type of Therapy:  Group Therapy\  Participation Level:  Did Not Attend   Nehemiah Settle 05/21/2018, 9:46 PM

## 2018-05-21 NOTE — [facility] Suicide Risk Assessment (Signed)
[facility] INPATIENT:  Family/Significant Other Suicide Prevention Education  Suicide Prevention Education:  Patient Refusal for Family/Significant Other Suicide Prevention Education: The patient Wendy Frazier has refused to provide written consent for family/significant other to be provided Family/Significant Other Suicide Prevention Education during admission and/or prior to discharge.  Physician notified.  Darin Engels 05/21/2018, 4:01 PM

## 2018-05-21 NOTE — Progress Notes (Signed)
Recreation Therapy Notes  Date: 05/20/2018  Time: 9:30 pm   Location: Craft Room   Behavioral response: N/A   Intervention Topic: Communication  Discussion/Intervention: Patient did not attend group.   Clinical Observations/Feedback:  Patient did not attend group.   Caree Wolpert LRT/CTRS        Dewight Catino 05/21/2018 11:49 AM

## 2018-05-21 NOTE — H&P (Signed)
Psychiatric Admission Assessment Adult  Patient Identification: Wendy Frazier MRN:  [id number] Date of Evaluation:  05/21/2018 Chief Complaint:  Suicidal thoughts Principal Diagnosis: Severe recurrent major depression without psychotic features [facility]) Diagnosis:   Patient Active Problem List   Diagnosis Date Noted   Asthma [J45.909] 05/20/2018   Severe recurrent major depression without psychotic features ([place]) [F33.2] 05/20/2018   Suicidal ideation [R45.851] 05/20/2018   Diabetes ([place]) [E11.9] 03/30/2015   Anemia [D64.9] 03/23/2015   Chest pain [R07.9] 07/28/2014   History of Present Illness: 47 yo female admitted due to depression, agitation and SI. Pt was very emotional in the ED talking about how she felt no one cares about her. She was very tearful and sad during evaluation in the ED. She voiced suicidal thoughts with a plan to walk in front of a train. She has not been caring for herself well and not taking medications for diabetes.   Upon evaluation today, she is much calmer and less irritable. She is forthcoming with information. She states that she went to her PCPs office and they wanted her to go to [facility] for evaluation. She states that she has bladder issues and urinated on herself while at [facility]. She states that the doctors there refused to see her at that time and "wouldn't let me sit down." She states that she left and went home and then the police called and brought her to the ED. She admits that she was extremely emotional and distraught at [facility]. She states, "Iv'e calmed down down." She states that she has struggled with depression off and on through her whole life. She states that she does not have much support. She was with a man but he suddenly left her recently. She called his phone and another woman answered "and said she was his girlfriend now." She states that she know he was seeing another woman but "i kept him around." She is mostly upset because he has a 6yo daughter that  she became very attached to and helped raise her. She is worried about her and wants her back in her life. This little girl is what keeps her going. She states that she works at [employer] and really likes her job. She has been there 8 years. She also got a new car and "needs to keep working to pay for my car payment." She is tearful during interview. She states that she sleeps a lot and always feels fatigued. She feels depressed. She does not have much m motivation or energy, especially since her boyfriend left. She denies SI currently. She states that she slept well last night and feels better rested today. She has been in her room most of the day.   Associated Signs/Symptoms: Depression Symptoms:  depressed mood, anhedonia, Hypersomnia, crying spells, SI, (Hypo) Manic Symptoms:  Denies symptoms of mania Anxiety Symptoms:  Excessive Worry, Psychotic Symptoms:  Denies AH, VH, Paranoia PTSD Symptoms: Negative Total Time spent with patient: 1 hour  Past Psychiatric History: Pt denies past psychiatric diagnosis. She was inpatient once in [place] "for 3 days" about 10 years ago. She reports 3 suicide attempts in the past including age 62 tried to drown herself, age 23 overdose and age 39 cut wrists. She states that she never got inpatient treatment for these attempts. She does not have mental health providers currently.   Is the patient at risk to self? Yes.    Has the patient been a risk to self in the past 6 months? No.  Has  the patient been a risk to self within the distant past? Yes.    Is the patient a risk to others? No.  Has the patient been a risk to others in the past 6 months? No.  Has the patient been a risk to others within the distant past? No.   Alcohol Screening: Patient refused Alcohol Screening Tool: Yes 1. How often do you have a drink containing alcohol?: Never 2. How many drinks containing alcohol do you have on a typical day when you are drinking?: 1 or 2 3. How often do you  have six or more drinks on one occasion?: Never AUDIT-C Score: 0 4. How often during the last year have you found that you were not able to stop drinking once you had started?: Never 5. How often during the last year have you failed to do what was normally expected from you becasue of drinking?: Never 6. How often during the last year have you needed a first drink in the morning to get yourself going after a heavy drinking session?: Never 7. How often during the last year have you had a feeling of guilt of remorse after drinking?: Never 8. How often during the last year have you been unable to remember what happened the night before because you had been drinking?: Never 9. Have you or someone else been injured as a result of your drinking?: No 10. Has a relative or friend or a doctor or another health worker been concerned about your drinking or suggested you cut down?: No Alcohol Use Disorder Identification Test Final Score (AUDIT): 0 Intervention/Follow-up: Continued Monitoring Substance Abuse History in the last 12 months:  No. Consequences of Substance Abuse: Negative Previous Psychotropic Medications: No  Psychological Evaluations: No  Past Medical History:  Past Medical History:  Diagnosis Date   Asthma    Diabetes mellitus without complication ([place])    History of venomous spider bite 2015   Hospital inpatient tx for 1 week.   Hyperlipidemia    Migraines    History reviewed. No pertinent surgical history. Family History:  Family History  Problem Relation Age of Onset   Cancer Mother        Breast   Diabetes Father    Aneurysm Father        Brain   Family Psychiatric  History: Unknown Tobacco Screening: Have you used any form of tobacco in the last 30 days? (Cigarettes, Smokeless Tobacco, Cigars, and/or Pipes): No Social History: Born and raised in [place]. She graduated high school. 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 She has been  there 8 years. No kids. She was divorced in the past. Her recent boyfriend left her out of the blue and she has no idea where he is. She currently lives with her sister but states that she does not have much family.  Social History   Substance and Sexual Activity  Alcohol Use No     Social History   Substance and Sexual Activity  Drug Use No    Additional Social History:                           Allergies:   Allergies  Allergen Reactions   Morphine And Related Other (See Comments)    confusion   Lab Results:  Results for orders placed or performed during the hospital encounter of 05/20/18 (from the past 48 hour(s))  Lipid panel  Status: Abnormal   Collection Time: 05/20/18 10:49 AM  Result Value Ref Range   Cholesterol 215 (H) 0 - 200 mg/dL   Triglycerides 187 (H) <150 mg/dL   HDL 43 >40 mg/dL   Total CHOL/HDL Ratio 5.0 RATIO   VLDL 37 0 - 40 mg/dL   LDL Cholesterol 135 (H) 0 - 99 mg/dL    Comment:        Total Cholesterol/HDL:CHD Risk Coronary Heart Disease Risk Table                     Men   Women  1/2 Average Risk   3.4   3.3  Average Risk       5.0   4.4  2 X Average Risk   9.6   7.1  3 X Average Risk  23.4   11.0        Use the calculated Patient Ratio above and the CHD Risk Table to determine the patient's CHD Risk.        ATP III CLASSIFICATION (LDL):  <100     mg/dL   Optimal  100-129  mg/dL   Near or Above                    Optimal  130-159  mg/dL   Borderline  160-189  mg/dL   High  >190     mg/dL   Very High Performed at [facility], [address] [place], [place] [address]   TSH     Status: None   Collection Time: 05/20/18 10:49 AM  Result Value Ref Range   TSH 1.480 0.350 - 4.500 uIU/mL    Comment: Performed by a 3rd Generation assay with a functional sensitivity of <=0.01 uIU/mL. Performed at [facility], [address] [place], [place] [address]   Glucose, capillary     Status: Abnormal   Collection  Time: 05/20/18  9:32 PM  Result Value Ref Range   Glucose-Capillary 232 (H) 70 - 99 mg/dL  Glucose, capillary     Status: Abnormal   Collection Time: 05/21/18  6:58 AM  Result Value Ref Range   Glucose-Capillary 312 (H) 70 - 99 mg/dL   Comment 1 Notify RN   Glucose, capillary     Status: Abnormal   Collection Time: 05/21/18 11:05 AM  Result Value Ref Range   Glucose-Capillary 229 (H) 70 - 99 mg/dL   Comment 1 Notify RN     Blood Alcohol level:  Lab Results  Component Value Date   ETH <10 09/81/1914    Metabolic Disorder Labs:  Lab Results  Component Value Date   HGBA1C 10.9 04/27/2015   No results found for: PROLACTIN Lab Results  Component Value Date   CHOL 215 (H) 05/20/2018   TRIG 187 (H) 05/20/2018   HDL 43 05/20/2018   CHOLHDL 5.0 05/20/2018   VLDL 37 05/20/2018   LDLCALC 135 (H) 05/20/2018   LDLCALC 97 11/06/2012    Current Medications: Current Facility-Administered Medications  Medication Dose Route Frequency Provider Last Rate Last Dose   acetaminophen (TYLENOL) tablet 650 mg  650 mg Oral Q6H PRN Clapacs, John T, MD   650 mg at 05/21/18 0534   albuterol (PROVENTIL) (2.5 MG/3ML) 0.083% nebulizer solution 2.5 mg  2.5 mg Inhalation Q6H PRN Clapacs, John T, MD       alum & mag hydroxide-simeth (MAALOX/MYLANTA) 200-200-20 MG/5ML suspension 30 mL  30 mL Oral Q4H PRN Clapacs, John T,  MD       hydrOXYzine (ATARAX/VISTARIL) tablet 50 mg  50 mg Oral TID PRN Clapacs, Madie Reno, MD       insulin aspart (novoLOG) injection 0-15 Units  0-15 Units Subcutaneous TID WC Clapacs, Madie Reno, MD   5 Units at 05/21/18 1200   insulin glargine (LANTUS) injection 16 Units  16 Units Subcutaneous Daily Monica Zahler, Tyson Babinski, MD   16 Units at 05/21/18 0911   magnesium hydroxide (MILK OF MAGNESIA) suspension 30 mL  30 mL Oral Daily PRN Clapacs, Madie Reno, MD       metFORMIN (GLUCOPHAGE) tablet 1,000 mg  1,000 mg Oral BID WC Clapacs, John T, MD   1,000 mg at 05/21/18 0847   metoprolol tartrate  (LOPRESSOR) tablet 12.5 mg  12.5 mg Oral BID Clapacs, John T, MD   12.5 mg at 05/21/18 0847   sertraline (ZOLOFT) tablet 50 mg  50 mg Oral Daily Sheran Newstrom, Tyson Babinski, MD   50 mg at 05/21/18 1159   traZODone (DESYREL) tablet 100 mg  100 mg Oral QHS PRN Clapacs, Madie Reno, MD   100 mg at 05/20/18 2136   PTA Medications: Medications Prior to Admission  Medication Sig Dispense Refill Last Dose   albuterol (PROVENTIL HFA;VENTOLIN HFA) 108 (90 Base) MCG/ACT inhaler Inhale 2 puffs into the lungs every 6 (six) hours as needed for wheezing or shortness of breath. 1 Inhaler 0 Unknown at PRN   aspirin EC 325 MG tablet Take 1 tablet (325 mg total) by mouth daily. 100 tablet 3 Unknown at Unknown   clotrimazole-betamethasone (LOTRISONE) cream Apply 1 application topically 2 (two) times daily. (Patient not taking: Reported on 05/20/2018) 45 g 1 Not Taking at Unknown time   fluconazole (DIFLUCAN) 150 MG tablet Take 1 tablet (150 mg total) by mouth once a week. (Patient not taking: Reported on 05/20/2018) 4 tablet 0 Not Taking at Unknown time   metFORMIN (GLUCOPHAGE) 1000 MG tablet Take 1 tablet (1,000 mg total) by mouth daily with breakfast. 30 tablet 2 Unknown at Unknown   metoCLOPramide (REGLAN) 10 MG tablet Take 1 tablet (10 mg total) by mouth every 6 (six) hours as needed for nausea. 20 tablet 0 Unknown at PRN   pantoprazole (PROTONIX) 40 MG tablet Take 1 tablet (40 mg total) by mouth daily. 30 tablet 1 Unknown at Unknown    Musculoskeletal: Strength & Muscle Tone: within normal limits Gait & Station: normal Patient leans: N/A  Psychiatric Specialty Exam: Physical Exam  Nursing note and vitals reviewed.   Review of Systems  All other systems reviewed and are negative.   Blood pressure 111/74, pulse 96, temperature 98.6 F (37 C), temperature source Oral, resp. rate 18, height 5\' 4"  (1.626 m), weight 83.9 kg, SpO2 97 %.Body mass index is 31.76 kg/m.  General Appearance: Disheveled  Eye Contact:   Minimal  Speech:  Garbled  Volume:  Decreased  Mood:  Depressed  Affect:  Tearful  Thought Process:  Coherent and Goal Directed  Orientation:  Full (Time, Place, and Person)  Thought Content:  Logical  Suicidal Thoughts:  No  Homicidal Thoughts:  No  Memory:  Immediate;   Fair  Judgement:  Fair  Insight:  Fair  Psychomotor Activity:  Normal  Concentration:  Concentration: Fair  Recall:  [employer] of Knowledge:  Fair  Language:  Fair  Akathisia:  No      Assets:  Resilience  ADL's:  Intact  Cognition:  WNL  Sleep:  Number of Hours:  6.5    Treatment Plan Summary: 47 yo female admitted due to [place]. Pt is more forthcoming with information today. She is tearful. Her boyfriend of 3 years left her out of the blue which has been very hard on her. She helped raise his daughter and she is very worried about her. This child is what keeps her going. She has mostly been isolative to her room. She does not have much support.  She is open to trying medications for depression. She could benefit from peer support to help her with a support network.   Plan:  Depression -start Zoloft 50 mg daily  Uncontrolled diabetes -She was restarted on metformin 1000 mg BID -Diabetes care coordinator on board and recommends Lantus 16 units daily -A1c in process  UA negative for infection, TSH normal  Elevated Lipids -will need PCP follow up  Dispo -Follow up with [facility] and possibly peer support.   Observation Level/Precautions:  15 minute checks  Laboratory:  Done in ED  Psychotherapy:    Medications:    Consultations:    Discharge Concerns:    Estimated LOS:  Other:     Physician Treatment Plan for Primary Diagnosis: Severe recurrent major depression without psychotic features ([place]) Long Term Goal(s): Improvement in symptoms so as ready for discharge  Short Term Goals: Ability to disclose and discuss suicidal ideas    I certify that inpatient services furnished can reasonably be expected  to improve the patient's condition.    Marylin Crosby, MD 10/30/20191:08 PM

## 2018-05-21 NOTE — Progress Notes (Signed)
°   05/21/18 1030  Clinical Encounter Type  Visited With Patient;Health care provider  Visit Type Initial;Spiritual support;Behavioral Health  Referral From Social work  Consult/Referral To Chaplain  Spiritual Encounters  Spiritual Needs Emotional;Other (Comment)   [place] attended the patient's treatment team meeting,. The patient set goals of getting Better and getting out of the hospital. I will follow up as need.

## 2018-05-21 NOTE — Progress Notes (Addendum)
Inpatient Diabetes Program Recommendations  AACE/ADA: New Consensus Statement on Inpatient Glycemic Control (2015)  Target Ranges:  Prepandial:   less than 140 mg/dL      Peak postprandial:   less than 180 mg/dL (1-2 hours)      Critically ill patients:  140 - 180 mg/dL   Results for Wendy Frazier, Wendy Frazier (MRN [id number]) as of 05/21/2018 07:56  Ref. Range 05/20/2018 17:05 05/20/2018 21:32 05/21/2018 06:58  Glucose-Capillary Latest Ref Range: 70 - 99 mg/dL 346 (H)  11 units NOVOLOG  232 (H) 312 (H)  11 units NOVOLOG     Admit with: Depression/ Suicidal  History: DM  Home DM Meds: Metformin 1000 mg BID (NOT taking)  Current Orders: Metformin 1000 mg BID      Novolog Moderate Correction Scale/ SSI (0-15 units) TID AC       MD- Please consider the following in-hospital insulin adjustments:  1. Start Lantus 16 units Daily (0.2 units/kg dosing based on weight of 83 kg)  Note A1c level pending--Expect it to be elevated given pt told Admitting MD she has not been taking her Metformin  When A1c results are back, we can figure out what kind of changes need to be made to pt's home DM medication regimen     --Will follow patient during hospitalization--  Wyn Quaker RN, MSN, CDE Diabetes Coordinator Inpatient Glycemic Control Team Team Pager: [phone] (8a-5p)

## 2018-05-21 NOTE — Plan of Care (Signed)
Patient verbalizes understanding of the general information that's been provided to her and all questions/concerns have been addressed and answered at this time. Patient denies SI/HI/AVH as well as any signs/symptoms of depression. Patient endorses anxiety stating that "I want to go home, so I won't lose my job". Patient has verbalized understanding of her prescribed therapeutic regimen and all questions/concerns have been addressed and answered at this time. Patient did not participate in unit groups today, however, she has been observed present in the milieu throughout the day. Patient has the ability to make decisions regarding her health-care. Patient remains safe on the unit.  Problem: Education: Goal: Knowledge of [facility] Education information/materials will improve Outcome: Progressing Goal: Emotional status will improve Outcome: Progressing Goal: Mental status will improve Outcome: Progressing Goal: Verbalization of understanding the information provided will improve Outcome: Progressing   Problem: Education: Goal: Utilization of techniques to improve thought processes will improve Outcome: Progressing Goal: Knowledge of the prescribed therapeutic regimen will improve Outcome: Progressing   Problem: Activity: Goal: Interest or engagement in leisure activities will improve Outcome: Progressing Goal: Imbalance in normal sleep/wake cycle will improve Outcome: Progressing   Problem: Coping: Goal: Coping ability will improve Outcome: Progressing Goal: Will verbalize feelings Outcome: Progressing   Problem: Health Behavior/Discharge Planning: Goal: Ability to make decisions will improve Outcome: Progressing Goal: Compliance with therapeutic regimen will improve Outcome: Progressing   Problem: Education: Goal: Ability to state activities that reduce stress will improve Outcome: Progressing   Problem: Coping: Goal: Ability to identify and develop effective coping  behavior will improve Outcome: Progressing   Problem: Self-Concept: Goal: Level of anxiety will decrease Outcome: Progressing Goal: Ability to modify response to factors that promote anxiety will improve Outcome: Progressing

## 2018-05-21 NOTE — Tx Team (Signed)
Interdisciplinary Treatment and Diagnostic Plan Update  05/21/2018 Time of Session: 11am Wendy Frazier MRN: [id number]  Principal Diagnosis: <principal problem not specified>  Secondary Diagnoses: Active Problems:   Severe recurrent major depression without psychotic features (HCC)   Current Medications:  Current Facility-Administered Medications  Medication Dose Route Frequency Provider Last Rate Last Dose   acetaminophen (TYLENOL) tablet 650 mg  650 mg Oral Q6H PRN Clapacs, Madie Reno, MD   650 mg at 05/21/18 0534   albuterol (PROVENTIL) (2.5 MG/3ML) 0.083% nebulizer solution 2.5 mg  2.5 mg Inhalation Q6H PRN Clapacs, Madie Reno, MD       alum & mag hydroxide-simeth (MAALOX/MYLANTA) 200-200-20 MG/5ML suspension 30 mL  30 mL Oral Q4H PRN Clapacs, Madie Reno, MD       hydrOXYzine (ATARAX/VISTARIL) tablet 50 mg  50 mg Oral TID PRN Clapacs, Madie Reno, MD       insulin aspart (novoLOG) injection 0-15 Units  0-15 Units Subcutaneous TID WC Clapacs, Madie Reno, MD   11 Units at 05/21/18 0701   insulin glargine (LANTUS) injection 16 Units  16 Units Subcutaneous Daily McNew, Tyson Babinski, MD   16 Units at 05/21/18 0911   magnesium hydroxide (MILK OF MAGNESIA) suspension 30 mL  30 mL Oral Daily PRN Clapacs, Madie Reno, MD       metFORMIN (GLUCOPHAGE) tablet 1,000 mg  1,000 mg Oral BID WC Clapacs, John T, MD   1,000 mg at 05/21/18 0847   metoprolol tartrate (LOPRESSOR) tablet 12.5 mg  12.5 mg Oral BID Clapacs, John T, MD   12.5 mg at 05/21/18 0847   sertraline (ZOLOFT) tablet 50 mg  50 mg Oral Daily McNew, Tyson Babinski, MD       traZODone (DESYREL) tablet 100 mg  100 mg Oral QHS PRN Clapacs, Madie Reno, MD   100 mg at 05/20/18 2136   PTA Medications: Medications Prior to Admission  Medication Sig Dispense Refill Last Dose   albuterol (PROVENTIL HFA;VENTOLIN HFA) 108 (90 Base) MCG/ACT inhaler Inhale 2 puffs into the lungs every 6 (six) hours as needed for wheezing or shortness of breath. 1 Inhaler 0 Unknown at PRN    aspirin EC 325 MG tablet Take 1 tablet (325 mg total) by mouth daily. 100 tablet 3 Unknown at Unknown   clotrimazole-betamethasone (LOTRISONE) cream Apply 1 application topically 2 (two) times daily. (Patient not taking: Reported on 05/20/2018) 45 g 1 Not Taking at Unknown time   fluconazole (DIFLUCAN) 150 MG tablet Take 1 tablet (150 mg total) by mouth once a week. (Patient not taking: Reported on 05/20/2018) 4 tablet 0 Not Taking at Unknown time   metFORMIN (GLUCOPHAGE) 1000 MG tablet Take 1 tablet (1,000 mg total) by mouth daily with breakfast. 30 tablet 2 Unknown at Unknown   metoCLOPramide (REGLAN) 10 MG tablet Take 1 tablet (10 mg total) by mouth every 6 (six) hours as needed for nausea. 20 tablet 0 Unknown at PRN   pantoprazole (PROTONIX) 40 MG tablet Take 1 tablet (40 mg total) by mouth daily. 30 tablet 1 Unknown at Unknown    Patient Stressors: Health problems Loss of Relationship Medication change or noncompliance  Patient Strengths: Ability for insight Communication skills Supportive family/friends  Treatment Modalities: Medication Management, Group therapy, Case management,  1 to 1 session with clinician, Psychoeducation, Recreational therapy.   Physician Treatment Plan for Primary Diagnosis: <principal problem not specified> Long Term Goal(s):     Short Term Goals:    Medication Management: Evaluate patient's response, side effects,  and tolerance of medication regimen.  Therapeutic Interventions: 1 to 1 sessions, Unit Group sessions and Medication administration.  Evaluation of Outcomes: Progressing  Physician Treatment Plan for Secondary Diagnosis: Active Problems:   Severe recurrent major depression without psychotic features ([place])  Long Term Goal(s):     Short Term Goals:       Medication Management: Evaluate patient's response, side effects, and tolerance of medication regimen.  Therapeutic Interventions: 1 to 1 sessions, Unit Group sessions and  Medication administration.  Evaluation of Outcomes: Progressing   RN Treatment Plan for Primary Diagnosis: <principal problem not specified> Long Term Goal(s): Knowledge of disease and therapeutic regimen to maintain health will improve  Short Term Goals: Ability to remain free from injury will improve, Ability to identify and develop effective coping behaviors will improve and Compliance with prescribed medications will improve  Medication Management: RN will administer medications as ordered by provider, will assess and evaluate patient's response and provide education to patient for prescribed medication. RN will report any adverse and/or side effects to prescribing provider.  Therapeutic Interventions: 1 on 1 counseling sessions, Psychoeducation, Medication administration, Evaluate responses to treatment, Monitor vital signs and CBGs as ordered, Perform/monitor CIWA, COWS, AIMS and Fall Risk screenings as ordered, Perform wound care treatments as ordered.  Evaluation of Outcomes: Progressing   LCSW Treatment Plan for Primary Diagnosis: <principal problem not specified> Long Term Goal(s): Safe transition to appropriate next level of care at discharge, Engage patient in therapeutic group addressing interpersonal concerns.  Short Term Goals: Engage patient in aftercare planning with referrals and resources, Increase social support, Identify triggers associated with mental health/substance abuse issues and Increase skills for wellness and recovery  Therapeutic Interventions: Assess for all discharge needs, 1 to 1 time with Social worker, Explore available resources and support systems, Assess for adequacy in community support network, Educate family and significant other(s) on suicide prevention, Complete Psychosocial Assessment, Interpersonal group therapy.  Evaluation of Outcomes: Progressing   Progress in Treatment: Attending groups: Yes. Participating in groups: Yes. Taking  medication as prescribed: Yes. Toleration medication: Yes. Family/Significant other contact made: No, will contact:  Family member if patient consents Patient understands diagnosis: Yes. Discussing patient identified problems/goals with staff: Yes. Medical problems stabilized or resolved: Yes. Denies suicidal/homicidal ideation: Yes. Issues/concerns per patient self-inventory: No. Other:   New problem(s) identified: Yes, Describe:  Has diabetes which is not controlled   New Short Term/Long Term Goal(s): "To go back to work."  Patient Goals:  "I just want to go home."  Discharge Plan or Barriers: To return home and follow up with an outpatient provider.  Reason for Continuation of Hospitalization: Medical Issues Medication stabilization  Estimated Length of Stay: 1-2 days.  Attendees: Patient: Wendy Frazier 05/21/2018 11:45 AM  Physician: Amador Cunas, MD 05/21/2018 11:45 AM  Nursing: Eugenio Hoes, RN 05/21/2018 11:45 AM  RN Care Manager: 05/21/2018 11:45 AM  Social Worker: Darin Engels, [place] 05/21/2018 11:45 AM  Recreational Therapist: Isaias Sakai. Marcello Fennel, LRT 05/21/2018 11:45 AM  Other: Sanjuana Kava, LCSW 05/21/2018 11:45 AM  Other: Derrek Gu, LCSW 05/21/2018 11:45 AM  Other: Marney Doctor, Chaplain  05/21/2018 11:45 AM    Scribe for Treatment Team: Darin Engels, LCSW 05/21/2018 11:45 AM

## 2018-05-21 NOTE — BHH Suicide Risk Assessment (Signed)
[facility] Admission Suicide Risk Assessment   Nursing information obtained from:  Patient Demographic factors:  Caucasian Current Mental Status:  NA Loss Factors:  Loss of significant relationship Historical Factors:  Prior suicide attempts Risk Reduction Factors:  Living with another person, especially a relative  Total Time spent with patient: 1 hour Principal Problem: Severe recurrent major depression without psychotic features [facility]) Diagnosis:   Patient Active Problem List   Diagnosis Date Noted   Severe recurrent major depression without psychotic features ([place]) [F33.2] 05/20/2018    Priority: High   Asthma [J45.909] 05/20/2018   Suicidal ideation [R45.851] 05/20/2018   Diabetes ([place]) [E11.9] 03/30/2015   Anemia [D64.9] 03/23/2015   Chest pain [R07.9] 07/28/2014   Subjective Data: See h&P  Continued Clinical Symptoms:  Alcohol Use Disorder Identification Test Final Score (AUDIT): 0 The "Alcohol Use Disorders Identification Test", Guidelines for Use in Primary Care, Second Edition.  World Pharmacologist [facility]). Score between 0-7:  no or low risk or alcohol related problems. Score between 8-15:  moderate risk of alcohol related problems. Score between 16-19:  high risk of alcohol related problems. Score 20 or above:  warrants further diagnostic evaluation for alcohol dependence and treatment.   CLINICAL FACTORS:   Severe Anxiety and/or Agitation Medical Diagnoses and Treatments/Surgeries   COGNITIVE FEATURES THAT CONTRIBUTE TO RISK:  None    SUICIDE RISK:   Mild:  Suicidal ideation of limited frequency, intensity, duration, and specificity.  There are no identifiable plans, no associated intent, mild dysphoria and related symptoms, good self-control (both objective and subjective assessment), few other risk factors, and identifiable protective factors, including available and accessible social support.  PLAN OF CARE: See h&P  I certify that inpatient services  furnished can reasonably be expected to improve the patient's condition.   Marylin Crosby, MD 05/21/2018, 1:25 PM

## 2018-05-21 NOTE — Progress Notes (Signed)
D- Patient alert and oriented. Patient presents in a pleasant mood on assessment stating that she slept fine last night and the only complaint that she had was pain in her left side, rating it a "8/10" stating to this writer "my side has been hurting for four months and I've been to two different hospitals and nobody knows what's wrong with me". Patient denies SI, HI, AVH, at this time. Patient reported on her self-inventory a depression level of "5/10" and her anxiety a "3/10", and she stated to this writer "I want to go home so I won't lose my job". Patient's goal for today is "to go home".  A- Scheduled medications administered to patient, per MD orders. Support and encouragement provided.  Routine safety checks conducted every 15 minutes.  Patient informed to notify staff with problems or concerns.  R- No adverse drug reactions noted. Patient contracts for safety at this time. Patient compliant with medications and treatment plan. Patient receptive, calm, and cooperative. Patient interacts well with others on the unit.  Patient remains safe at this time.
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
## 2018-05-22 LAB — GLUCOSE, CAPILLARY
GLUCOSE-CAPILLARY: 216 mg/dL — AB (ref 70–99)
Glucose-Capillary: 214 mg/dL — ABNORMAL HIGH (ref 70–99)
Glucose-Capillary: 155 mg/dL — ABNORMAL HIGH (ref 70–99)
Glucose-Capillary: 145 mg/dL — ABNORMAL HIGH (ref 70–99)

## 2018-05-22 MED ORDER — METOPROLOL TARTRATE 25 MG PO TABS: 12.5000 mg | ORAL_TABLET | Freq: Two times a day (BID) | ORAL | 1 refills | Status: DC

## 2018-05-22 MED ORDER — GLIMEPIRIDE 2 MG PO TABS
2.0000 mg | ORAL_TABLET | Freq: Every day | ORAL | Status: DC
  Administered 2018-05-23: 2 mg via ORAL
  Filled 2018-05-22: qty 1

## 2018-05-22 MED ORDER — INSULIN GLARGINE 100 UNIT/ML ~~LOC~~ SOLN
4.0000 [IU] | Freq: Once | SUBCUTANEOUS | Status: AC
  Administered 2018-05-22: 4 [IU] via SUBCUTANEOUS
  Filled 2018-05-22: qty 0.04

## 2018-05-22 MED ORDER — GLIMEPIRIDE 2 MG PO TABS: 2.0000 mg | ORAL_TABLET | Freq: Every day | ORAL | 1 refills | Status: DC

## 2018-05-22 MED ORDER — METFORMIN HCL ER 500 MG PO TB24: 1000.0000 mg | ORAL_TABLET | Freq: Every day | ORAL | 1 refills | Status: DC

## 2018-05-22 MED ORDER — SERTRALINE HCL 50 MG PO TABS: 50.0000 mg | ORAL_TABLET | Freq: Every day | ORAL | 1 refills | Status: DC

## 2018-05-22 MED ORDER — INSULIN GLARGINE 100 UNIT/ML ~~LOC~~ SOLN
20.0000 [IU] | Freq: Every day | SUBCUTANEOUS | Status: DC
  Administered 2018-05-23: 20 [IU] via SUBCUTANEOUS
  Filled 2018-05-22: qty 0.2

## 2018-05-22 NOTE — BHH Group Notes (Signed)
LCSW Group Therapy Note  05/22/2018 1:00 pm  Type of Therapy/Topic:  Group Therapy:  Balance in Life  Participation Level:  Did Not Attend  Description of Group:    This group will address the concept of balance and how it feels and looks when one is unbalanced. Patients will be encouraged to process areas in their lives that are out of balance and identify reasons for remaining unbalanced. Facilitators will guide patients in utilizing problem-solving interventions to address and correct the stressor making their life unbalanced. Understanding and applying boundaries will be explored and addressed for obtaining and maintaining a balanced life. Patients will be encouraged to explore ways to assertively make their unbalanced needs known to significant others in their lives, using other group members and facilitator for support and feedback.  Therapeutic Goals: 1. Patient will identify two or more emotions or situations they have that consume much of in their lives. 2. Patient will identify signs/triggers that life has become out of balance:  3. Patient will identify two ways to set boundaries in order to achieve balance in their lives:  4. Patient will demonstrate ability to communicate their needs through discussion and/or role plays  Summary of Patient Progress:      Therapeutic Modalities:   Cognitive Behavioral Therapy Solution-Focused Therapy Assertiveness Training  Devona Konig, LCSW 05/22/2018 3:46 PM

## 2018-05-22 NOTE — Plan of Care (Signed)
Patient stayed in bed most of the shift states "insulin makes my stomach upset."Patient appropriate in the unit.Denies SI,HI and AVH.Looking forward for discharge tomorrow.Compliant with medications.Appetite and energy level fair.Support and encouragement given.

## 2018-05-22 NOTE — Plan of Care (Signed)
Cooperative and compliant  with treatment

## 2018-05-22 NOTE — Progress Notes (Signed)
Patient  Stayed in bed and did not attend evening activities. Reported that she was feeling tired. Upon approach, patient was pleasant and cooperative and denying suicidal thoughts. Requested to be left alone so that she can get some rest. Did not get up. Has been sleeping and appears to be comfortable in bed. Safety monitored per unit protocol.

## 2018-05-22 NOTE — Progress Notes (Signed)
Inpatient Diabetes Program Recommendations  AACE/ADA: New Consensus Statement on Inpatient Glycemic Control (2015)  Target Ranges:  Prepandial:   less than 140 mg/dL      Peak postprandial:   less than 180 mg/dL (1-2 hours)      Critically ill patients:  140 - 180 mg/dL   Results for Swilling, Wendy Frazier (MRN [id number]) as of 05/22/2018 09:05  Ref. Range 05/21/2018 06:58 05/21/2018 11:05 05/21/2018 16:14 05/21/2018 20:57  Glucose-Capillary Latest Ref Range: 70 - 99 mg/dL 312 (H)  11 units NOVOLOG +  16 units LANTUS at 9am  229 (H)  5 units NOVOLOG  160 (H)  3 units NOVOLOG  198 (H)   Results for Jelinek, [name] (MRN [id number]) as of 05/22/2018 09:05  Ref. Range 05/22/2018 06:57  Glucose-Capillary Latest Ref Range: 70 - 99 mg/dL 216 (H)  5 units NOVOLOG +  16 units LANTUS   Results for Meints, [name] (MRN [id number]) as of 05/22/2018 09:05  Ref. Range 05/20/2018 10:49  Hemoglobin A1C Latest Ref Range: 4.8 - 5.6 % 11.8 (H)  (291 mg/dl)    Admit with: Depression/ Suicidal  History: DM  Home DM Meds: Metformin 1000 mg BID (NOT taking)  Current Orders: Metformin 1000 mg BID                            Novolog Moderate Correction Scale/ SSI (0-15 units) TID AC       Lantus 16 units Daily     Note Lantus started yesterday AM.  Still elevated this AM.    MD- As expected, current A1c level came back quite elevated to 11.8%.  Per notes, patient has not been taking her Metformin at home.  Not sure if she would be willing to take Insulin at home and also not sure if patient covered by insurance??  If she has insurance coverage and is willing to take insulin, we could start her on one injection of basal insulin daily (Lantus) + continue her Metformin at home (if she is willing to take insulin and if she can afford Lantus).  If she does not have insurance but is willing to take insulin, we could switch her to 70/30 Insulin BID as patient can buy that Insulin at [facility] for $25 per  vial.  If patient is unwilling to take Insulin at home, suggest that we continue her Metformin and Add a Sulfonylurea medication like Amaryl (glimepiride) 2 mg Daily.  Patient really needs a PCP to follow up with after d/c for ongoing medical management for her diabetes.     MD- Please consider the following in-hospital insulin adjustments:  Increase Lantus to 20 units Daily  Since patient has already gotten dose this AM, please place order for Lantus 4 units X 1 dose to be given this AM     --Will follow patient during hospitalization--  Wyn Quaker RN, MSN, CDE Diabetes Coordinator Inpatient Glycemic Control Team Team Pager: [phone] (8a-5p)

## 2018-05-22 NOTE — Progress Notes (Signed)
Recreation Therapy Notes  Date: 05/22/2018  Time: 9:30 pm   Location: Craft Room   Behavioral response: N/A   Intervention Topic: Emotions  Discussion/Intervention: Patient did not attend group.   Clinical Observations/Feedback:  Patient did not attend group.   Anniya Whiters LRT/CTRS        Meiah Zamudio 05/22/2018 11:06 AM

## 2018-05-22 NOTE — Progress Notes (Signed)
[facility] MD Progress Note  05/22/2018 2:36 PM Wendy Frazier  MRN:  [id number] Subjective:  Pt has brighter affect today and more talkative. She states that she slept well last night and is feeling better rested. She states that her mind feels more calm and less anxious. She states that she met with Lorella Nimrod to talk about peer support and she states that this seems like a good idea to her. She talks about her job and that she has worked in [employer] for 16 years. She stats that she feels that she is good at her job but one of her bosses puts her down at  [employer]. she states that she will take off tomorrow but return to work on Saturday. She states that she is still worried about her ex'boyfriend daughter that she helped raise. She states that she is going to check with the school when she gets out to make sure she has been going and to make sure she is safe.  She talks about Her past Halloweens with her and smiles when talking about her. When asked, she denies SI or thoughts of self harm. She states that she tried to go to groups but had diarrhea and was scared to go because of this. We discussed utmost importance of managing diabetes and risks of not taking her medications. She agrees to take them at home.   Principal Problem: Severe recurrent major depression without psychotic features (HCC) Diagnosis:   Patient Active Problem List   Diagnosis Date Noted  . Severe recurrent major depression without psychotic features (HCC) [F33.2] 05/20/2018    Priority: High  . Asthma [J45.909] 05/20/2018  . Suicidal ideation [R45.851] 05/20/2018  . Diabetes (HCC) [E11.9] 03/30/2015  . Anemia [D64.9] 03/23/2015  . Chest pain [R07.9] 07/28/2014   Total Time spent with patient: 30 minutes  Past Psychiatric History: See h&P  Past Medical History:  Past Medical History:  Diagnosis Date  . Asthma   . Diabetes mellitus without complication (HCC)   . History of venomous spider bite 2015   Hospital inpatient tx for 1  week.  . Hyperlipidemia   . Migraines    History reviewed. No pertinent surgical history. Family History:  Family History  Problem Relation Age of Onset  . Cancer Mother        Breast  . Diabetes Father   . Aneurysm Father        Brain   Family Psychiatric  History: See H&P Social History:  Social History   Substance and Sexual Activity  Alcohol Use No     Social History   Substance and Sexual Activity  Drug Use No    Social History   Socioeconomic History  . Marital status: Divorced    Spouse name: Not on file  . Number of children: Not on file  . Years of education: Not on file  . Highest education level: Not on file  Occupational History  . Not on file  Social Needs  . Financial resource strain: Not on file  . Food insecurity:    Worry: Not on file    Inability: Not on file  . Transportation needs:    Medical: Not on file    Non-medical: Not on file  Tobacco Use  . Smoking status: Never Smoker  . Smokeless tobacco: Never Used  Substance and Sexual Activity  . Alcohol use: No  . Drug use: No  . Sexual activity: Not on file  Lifestyle  . Physical activity:  Days per week: Not on file    Minutes per session: Not on file  . Stress: Not on file  Relationships  . Social connections:    Talks on phone: Not on file    Gets together: Not on file    Attends religious service: Not on file    Active member of club or organization: Not on file    Attends meetings of clubs or organizations: Not on file    Relationship status: Not on file  Other Topics Concern  . Not on file  Social History Narrative  . Not on file   Additional Social History:                         Sleep: Good  Appetite:  Good  Current Medications: Current Facility-Administered Medications  Medication Dose Route Frequency Provider Last Rate Last Dose  . acetaminophen (TYLENOL) tablet 650 mg  650 mg Oral Q6H PRN Clapacs, Jackquline Denmark, MD   650 mg at 05/21/18 1644  . albuterol  (PROVENTIL) (2.5 MG/3ML) 0.083% nebulizer solution 2.5 mg  2.5 mg Inhalation Q6H PRN Clapacs, John T, MD      . alum & mag hydroxide-simeth (MAALOX/MYLANTA) 200-200-20 MG/5ML suspension 30 mL  30 mL Oral Q4H PRN Clapacs, Jackquline Denmark, MD      . Melene Muller ON 05/23/2018] glimepiride (AMARYL) tablet 2 mg  2 mg Oral Q breakfast Brighten Orndoff R, MD      . hydrOXYzine (ATARAX/VISTARIL) tablet 50 mg  50 mg Oral TID PRN Clapacs, John T, MD      . insulin aspart (novoLOG) injection 0-15 Units  0-15 Units Subcutaneous TID WC Clapacs, Jackquline Denmark, MD   5 Units at 05/22/18 1154  . [START ON 05/23/2018] insulin glargine (LANTUS) injection 20 Units  20 Units Subcutaneous Daily Frimet Durfee R, MD      . magnesium hydroxide (MILK OF MAGNESIA) suspension 30 mL  30 mL Oral Daily PRN Clapacs, John T, MD      . metFORMIN (GLUCOPHAGE) tablet 1,000 mg  1,000 mg Oral BID WC Clapacs, Jackquline Denmark, MD   1,000 mg at 05/22/18 0811  . metoprolol tartrate (LOPRESSOR) tablet 12.5 mg  12.5 mg Oral BID Clapacs, Jackquline Denmark, MD   12.5 mg at 05/22/18 1610  . sertraline (ZOLOFT) tablet 50 mg  50 mg Oral Daily Jontae Adebayo, Ileene Hutchinson, MD   50 mg at 05/22/18 0811  . traZODone (DESYREL) tablet 100 mg  100 mg Oral QHS PRN Clapacs, Jackquline Denmark, MD   100 mg at 05/20/18 2136    Lab Results:  Results for orders placed or performed during the hospital encounter of 05/20/18 (from the past 48 hour(s))  Glucose, capillary     Status: Abnormal   Collection Time: 05/20/18  9:32 PM  Result Value Ref Range   Glucose-Capillary 232 (H) 70 - 99 mg/dL  Glucose, capillary     Status: Abnormal   Collection Time: 05/21/18  6:58 AM  Result Value Ref Range   Glucose-Capillary 312 (H) 70 - 99 mg/dL   Comment 1 Notify RN   Glucose, capillary     Status: Abnormal   Collection Time: 05/21/18 11:05 AM  Result Value Ref Range   Glucose-Capillary 229 (H) 70 - 99 mg/dL   Comment 1 Notify RN   Glucose, capillary     Status: Abnormal   Collection Time: 05/21/18  4:14 PM  Result Value Ref Range    Glucose-Capillary 160 (H)  70 - 99 mg/dL   Comment 1 Notify RN   Glucose, capillary     Status: Abnormal   Collection Time: 05/21/18  8:57 PM  Result Value Ref Range   Glucose-Capillary 198 (H) 70 - 99 mg/dL  Glucose, capillary     Status: Abnormal   Collection Time: 05/22/18  6:57 AM  Result Value Ref Range   Glucose-Capillary 216 (H) 70 - 99 mg/dL   Comment 1 Notify RN   Glucose, capillary     Status: Abnormal   Collection Time: 05/22/18 11:19 AM  Result Value Ref Range   Glucose-Capillary 214 (H) 70 - 99 mg/dL    Blood Alcohol level:  Lab Results  Component Value Date   ETH <10 05/20/2018    Metabolic Disorder Labs: Lab Results  Component Value Date   HGBA1C 11.8 (H) 05/20/2018   MPG 291.96 05/20/2018   No results found for: PROLACTIN Lab Results  Component Value Date   CHOL 215 (H) 05/20/2018   TRIG 187 (H) 05/20/2018   HDL 43 05/20/2018   CHOLHDL 5.0 05/20/2018   VLDL 37 05/20/2018   LDLCALC 135 (H) 05/20/2018   LDLCALC 97 11/06/2012    Physical Findings: AIMS:  , ,  ,  ,    CIWA:    COWS:     Musculoskeletal: Strength & Muscle Tone: within normal limits Gait & Station: normal Patient leans: N/A  Psychiatric Specialty Exam: Physical Exam  Nursing note and vitals reviewed.   Review of Systems  All other systems reviewed and are negative.   Blood pressure 126/81, pulse 96, temperature 98.4 F (36.9 C), temperature source Oral, resp. rate 16, height 5\' 4"  (1.626 m), weight 83.9 kg, SpO2 98 %.Body mass index is 31.76 kg/m.  General Appearance: Casual  Eye Contact:  Good  Speech:  Clear and Coherent  Volume:  Normal  Mood:  Euthymic  Affect:  Appropriate  Thought Process:  Coherent and Goal Directed  Orientation:  Full (Time, Place, and Person)  Thought Content:  Logical  Suicidal Thoughts:  No  Homicidal Thoughts:  No  Memory:  Immediate;   Fair  Judgement:  Fair  Insight:  Fair  Psychomotor Activity:  Normal  Concentration:   Concentration: Fair  Recall:  [employer] of Knowledge:  Fair  Language:  Fair  Akathisia:  No      Assets:  Resilience  ADL's:  Intact  Cognition:  WNL  Sleep:  Number of Hours: 5.45     Treatment Plan Summary: 47 yo female admitted due to SI. She is calm today and much more forthcoming with information. Her affect is brighter and smiling and talking about postive things in her life. She did spend time outside today in the afternoon. She is taking medications. She is much more future oriented. Loneliness seems to be a major contributing factor.   Plan:  Depression -Continue Zoloft 50 mg daily  Uncontrolled Diabetes -Will switch metformin to XR due to diarrhea -She is not good candidate for insulin and is likely to not be compliant. Diabetes care manager suggested starting glimepiride with metformin  Dispo -She will likely discharge home tomorrow. She will follow up with [facility] for peer support for better support network. Follow up with [facility] for therapy and med management. She will follow up with [facility] for PCP  Haskell Riling, MD 05/22/2018, 2:36 PM

## 2018-05-23 LAB — GLUCOSE, CAPILLARY
GLUCOSE-CAPILLARY: 273 mg/dL — AB (ref 70–99)
GLUCOSE-CAPILLARY: 100 mg/dL — AB (ref 70–99)

## 2018-05-23 MED ORDER — METFORMIN HCL ER 500 MG PO TB24: 1000.0000 mg | ORAL_TABLET | Freq: Every day | ORAL | 0 refills | Status: AC

## 2018-05-23 MED ORDER — GLIMEPIRIDE 2 MG PO TABS: 2.0000 mg | ORAL_TABLET | Freq: Every day | ORAL | 0 refills | Status: AC

## 2018-05-23 MED ORDER — SERTRALINE HCL 50 MG PO TABS: 50.0000 mg | ORAL_TABLET | Freq: Every day | ORAL | 1 refills | Status: AC

## 2018-05-23 NOTE — Progress Notes (Signed)
Recreation Therapy Notes          Tamyah Cutbirth 05/23/2018 11:16 AM

## 2018-05-23 NOTE — Progress Notes (Signed)
Patient denies SI/HI, denies A/V hallucinations. Patient verbalizes understanding of discharge instructions, follow up care and prescriptions.7 days medicines given to patient. Patient given all belongings from [facility] locker. Patient escorted out by staff, transported by family.

## 2018-05-23 NOTE — Discharge Summary (Signed)
Physician Discharge Summary Note  Patient:  Wendy Frazier is an 47 y.o., female MRN:  [id number] DOB:  [birth year] Patient phone:  [phone] (home)  Patient address:   [place] [address],  Total Time spent with patient: 15 minutes  Plus 20 minutes of medication reconciliation, discharge planning, and discharge documentation   Date of Admission:  05/20/2018 Date of Discharge: 05/23/18  Reason for Admission:  47 yo female admitted due to depression, agitation and SI. Pt was very emotional in the ED talking about how she felt no one cares about her. She was very tearful and sad during evaluation in the ED. She voiced suicidal thoughts with a plan to walk in front of a train. She has not been caring for herself well and not taking medications for diabetes.             Upon evaluation today, she is much calmer and less irritable. She is forthcoming with information. She states that she went to her PCPs office and they wanted her to go to [facility] for evaluation. She states that she has bladder issues and urinated on herself while at [facility]. She states that the doctors there refused to see her at that time and "wouldn't let me sit down." She states that she left and went home and then the police called and brought her to the ED. She admits that she was extremely emotional and distraught at [facility]. She states, "Iv'e calmed down down." She states that she has struggled with depression off and on through her whole life. She states that she does not have much support. She was with a man but he suddenly left her recently. She called his phone and another woman answered "and said she was his girlfriend now." She states that she know he was seeing another woman but "i kept him around." She is mostly upset because he has a 6yo daughter that she became very attached to and helped raise her. She is worried about her and wants her back in her life. This little girl is what keeps her going. She states that she  works at [employer] and really likes her job. She has been there 8 years. She also got a new car and "needs to keep working to pay for my car payment." She is tearful during interview. She states that she sleeps a lot and always feels fatigued. She feels depressed. She does not have much m motivation or energy, especially since her boyfriend left. She denies SI currently. She states that she slept well last night and feels better rested today. She has been in her room most of the day.    Principal Problem: Severe recurrent major depression without psychotic features [facility]) Discharge Diagnoses: Patient Active Problem List   Diagnosis Date Noted   Severe recurrent major depression without psychotic features ([place]) [F33.2] 05/20/2018    Priority: High   Asthma [J45.909] 05/20/2018   Suicidal ideation [R45.851] 05/20/2018   Diabetes ([place]) [E11.9] 03/30/2015   Anemia [D64.9] 03/23/2015   Chest pain [R07.9] 07/28/2014    Past Psychiatric History: See H&P  Past Medical History:  Past Medical History:  Diagnosis Date   Asthma    Diabetes mellitus without complication ([place])    History of venomous spider bite 2015   Hospital inpatient tx for 1 week.   Hyperlipidemia    Migraines    History reviewed. No pertinent surgical history. Family History:  Family History  Problem Relation Age of Onset  Cancer Mother        Breast   Diabetes Father    Aneurysm Father        Brain   Family Psychiatric  History: See H&P Social History:  Social History   Substance and Sexual Activity  Alcohol Use No     Social History   Substance and Sexual Activity  Drug Use No    Social History   Socioeconomic History   Marital status: Divorced    Spouse name: Not on file   Number of children: Not on file   Years of education: Not on file   Highest education level: Not on file  Occupational History   Not on file  Social Needs   Financial resource strain: Not on file   Food  insecurity:    Worry: Not on file    Inability: Not on file   Transportation needs:    Medical: Not on file    Non-medical: Not on file  Tobacco Use   Smoking status: Never Smoker   Smokeless tobacco: Never Used  Substance and Sexual Activity   Alcohol use: No   Drug use: No   Sexual activity: Not on file  Lifestyle   Physical activity:    Days per week: Not on file    Minutes per session: Not on file   Stress: Not on file  Relationships   Social connections:    Talks on phone: Not on file    Gets together: Not on file    Attends religious service: Not on file    Active member of club or organization: Not on file    Attends meetings of clubs or organizations: Not on file    Relationship status: Not on file  Other Topics Concern   Not on file  Social History Narrative   Not on file    Hospital Course:  Pt was started on Zoloft for depression. Diabetes care coordinator recommended Metformin and glimepiride for diabetes management. Pt did attend some groups. On day of discharge, she was much calmer. She had brighter affect and was much more forthcoming with information. She was smiling.  When asked,she denies SI or any thoughts of self harm. She was future oriented and excited to get back to work. She is still worried about her ex-boyfriends little daughter but she is going to check on her when she gets out to make sure she is ok. She plans to have lunch with her friend [employer] today when she leaves.  She feels her thoughts are much more calm. She has been consistently denying SI or any thoughts of self harm. She feels that being connected with peer support and therapy will be helpful for her and is willing to pursue this. We also discussed follow up with a PCP regarding her diabetes and risks of letting her blood sugars get out of control. She requested discharge today to return to work and no longer meets Ideal IVC criteria. She requested a letter to return to work which was  provided.   The patient is at low risk of imminent suicide. Patient denied thoughts, intent, or plan for harm to self or others, expressed significant future orientation, and expressed an ability to mobilize assistance for her needs. She her risk for lethality. Chronic risk for lethality is elevated in light of loneliness, lack of support (she is being set up with peer support). Her boyfriend recently leaving her. The chronic risk is presently mitigated by her ongoing desire and engagement  in [facility] treatment and mobilization of support from family and friends. Chronic risk may elevate if she experiences any significant loss or worsening of symptoms, which can be managed and monitored through outpatient providers. At this time,a cute risk for lethality is low and she is stable for ongoing outpatient management.   Modifiable risk factors were addressed during this hospitalization through appropriate pharmacotherapy and establishment of outpatient follow-up treatment. Some risk factors for suicide are situational (i.e. Unstable housing) or related personality pathology (i.e. Poor coping mechanisms) and thus cannot be further mitigated by continued hospitalization in this setting.    Physical Findings: AIMS:  , ,  ,  ,    CIWA:    COWS:     Musculoskeletal: Strength & Muscle Tone: within normal limits Gait & Station: normal Patient leans: N/A  Psychiatric Specialty Exam: Physical Exam  Nursing note and vitals reviewed.   Review of Systems  All other systems reviewed and are negative.   Blood pressure 100/72, pulse 94, temperature 98.9 F (37.2 C), temperature source Oral, resp. rate 16, height 5\' 4"  (1.626 m), weight 83.9 kg, SpO2 97 %.Body mass index is 31.76 kg/m.  General Appearance: Casual  Eye Contact:  Good  Speech:  Normal Rate  Volume:  Normal  Mood:  Euthymic  Affect:  Appropriate  Thought Process:  Coherent and Goal Directed  Orientation:  Full (Time, Place, and Person)  Thought  Content:  Logical  Suicidal Thoughts:  No  Homicidal Thoughts:  No  Memory:  Immediate;   Fair  Judgement:  Fair  Insight:  Fair  Psychomotor Activity:  Normal  Concentration:  Concentration: Fair  Recall:  [place] of Knowledge:  Fair  Language:  Fair  Akathisia:  No      Assets:  Resilience  ADL's:  Intact  Cognition:  WNL  Sleep:  Number of Hours: 5.45     Have you used any form of tobacco in the last 30 days? (Cigarettes, Smokeless Tobacco, Cigars, and/or Pipes): No  Has this patient used any form of tobacco in the last 30 days? (Cigarettes, Smokeless Tobacco, Cigars, and/or Pipes) No  Blood Alcohol level:  Lab Results  Component Value Date   ETH <10 52/77/8242    Metabolic Disorder Labs:  Lab Results  Component Value Date   HGBA1C 11.8 (H) 05/20/2018   MPG 291.96 05/20/2018   No results found for: PROLACTIN Lab Results  Component Value Date   CHOL 215 (H) 05/20/2018   TRIG 187 (H) 05/20/2018   HDL 43 05/20/2018   CHOLHDL 5.0 05/20/2018   VLDL 37 05/20/2018   LDLCALC 135 (H) 05/20/2018   LDLCALC 97 11/06/2012    See Psychiatric Specialty Exam and Suicide Risk Assessment completed by Attending Physician prior to discharge.  Discharge destination:  Home  Is patient on multiple antipsychotic therapies at discharge:  No   Has Patient had three or more failed trials of antipsychotic monotherapy by history:  No  Recommended Plan for Multiple Antipsychotic Therapies: NA   Allergies as of 05/23/2018      Reactions   Morphine And Related Other (See Comments)   confusion      Medication List    STOP taking these medications   clotrimazole-betamethasone cream Commonly known as:  LOTRISONE   fluconazole 150 MG tablet Commonly known as:  DIFLUCAN   metFORMIN 1000 MG tablet Commonly known as:  GLUCOPHAGE Replaced by:  metFORMIN 500 MG 24 hr tablet   metoCLOPramide 10 MG tablet  Commonly known as:  REGLAN     TAKE these medications      Indication  albuterol 108 (90 Base) MCG/ACT inhaler Commonly known as:  PROVENTIL HFA;VENTOLIN HFA Inhale 2 puffs into the lungs every 6 (six) hours as needed for wheezing or shortness of breath.  Indication:  Asthma   aspirin EC 325 MG tablet Take 1 tablet (325 mg total) by mouth daily.  Indication:  Pain   glimepiride 2 MG tablet Commonly known as:  AMARYL Take 1 tablet (2 mg total) by mouth daily with breakfast.  Indication:  Type 2 Diabetes   metFORMIN 500 MG 24 hr tablet Commonly known as:  GLUCOPHAGE-XR Take 2 tablets (1,000 mg total) by mouth daily with breakfast. Replaces:  metFORMIN 1000 MG tablet  Indication:  Type 2 Diabetes   pantoprazole 40 MG tablet Commonly known as:  PROTONIX Take 1 tablet (40 mg total) by mouth daily.  Indication:  Gastroesophageal Reflux Disease   sertraline 50 MG tablet Commonly known as:  ZOLOFT Take 1 tablet (50 mg total) by mouth daily.  Indication:  Major Depressive Disorder      Follow-up Information    Pc, [employer]. Go on 05/26/2018.   Why:  Please follow up on Monday May 26, 2018 at 8:30am for your hospital follow up appointment. Please bring your ID, discharge paperwork and all medications that you are taking. Thank you. Contact information: [place] [address] [phone]        [employer] on 05/26/2018.   Why:  Please meet with Sherrian Divers on Monday May 26, 2018 at 7am for peer support services. His number is [phone]. Thank you. Contact information: [place] [address] [place]. Schedule an appointment as soon as possible for a visit in 1 week(s).   Why:  for diabetes managment Contact information: [place] [address] [phone]            Signed: Marylin Crosby, MD 05/23/2018, 8:10 AM

## 2018-05-23 NOTE — [facility]HH Suicide Risk Assessment (Signed)
[facility] Discharge Suicide Risk Assessment   Principal Problem: Severe recurrent major depression without psychotic features [facility]) Discharge Diagnoses:  Patient Active Problem List   Diagnosis Date Noted  . Severe recurrent major depression without psychotic features (HCC) [F33.2] 05/20/2018    Priority: High  . Asthma [J45.909] 05/20/2018  . Suicidal ideation [R45.851] 05/20/2018  . Diabetes (HCC) [E11.9] 03/30/2015  . Anemia [D64.9] 03/23/2015  . Chest pain [R07.9] 07/28/2014    Mental Status Per Nursing Assessment::   On Admission:  NA  Demographic Factors:  Divorced or widowed and Caucasian  Loss Factors: Loss of significant relationship  Historical Factors: Impulsivity  Risk Reduction Factors:   Employed, Living with another person, especially a relative and Positive coping skills or problem solving skills  Continued Clinical Symptoms:  Previous Psychiatric Diagnoses and Treatments  Cognitive Features That Contribute To Risk:  None    Suicide Risk:  Minimal Acute Risk: No identifiable suicidal ideation.   Follow-up Information    Pc, [employer]. Go on 05/26/2018.   Why:  Please follow up on Monday May 26, 2018 at 8:30am for your hospital follow up appointment. Please bring your ID, discharge paperwork and all medications that you are taking. Thank you. Contact information: [address] [place] [address] [phone]        [facility] Go on 05/26/2018.   Why:  Please meet with Unk Pinto on Monday May 26, 2018 at 7am for peer support services. His number is [phone]. Thank you. Contact information: [address] [place] [address] [phone]        [facility] Schedule an appointment as soon as possible for a visit in 1 week(s).   Why:  for diabetes managment Contact information: [address] [place] [address] [phone]           Plan Of Care/Follow-up  recommendations: See your PCP for diabetes managment  Haskell Riling, MD 05/23/2018, 8:09 AM

## 2018-05-23 NOTE — Plan of Care (Signed)
Thought process and emotional status improved. Compliant with treatment and expressing readiness for discharge

## 2018-05-23 NOTE — Progress Notes (Signed)
Wendy Frazier was in bed awake at the beginning of this shift. Pleasant upon approach. Patient was pleasant and cooperative. Alert and oriented and denying thoughts of self harm. Expressing readiness for discharge " I can't wait to get back to work".  Patient stayed in bed until snack and medication time. CBG  145.  Patient went back to bed  But unable to sleep. Received Trazodone at 2106 but it was not effective. Patient remained restless. At [place], patient received Vistaril, 50 mg by mouth. Patient has just fallen asleep. Staff continue to monitor for safety.

## 2018-05-23 NOTE — Progress Notes (Signed)
°  [facility] Adult Case Management Discharge Plan :  Will you be returning to the same living situation after discharge:  Yes,  Home At discharge, do you have transportation home?: Yes,  Taxi Do you have the ability to pay for your medications: Yes,  Referred to medication mangement clinic  Release of information consent forms completed and in the chart;  Patient's signature needed at discharge.  Patient to Follow up at: Follow-up Information    Pc, [employer]. Go on 05/26/2018.   Why:  Please follow up on Monday May 26, 2018 at 8:30am for your hospital follow up appointment. Please bring your ID, discharge paperwork and all medications that you are taking. Thank you. Contact information: [place] [address] [phone]        [place] on 05/26/2018.   Why:  Please meet with Sherrian Divers on Monday May 26, 2018 at 7am for peer support services. His number is [phone]. Thank you. Contact information: [place] [address] [place]. Schedule an appointment as soon as possible for a visit in 1 week(s).   Why:  for diabetes managment Contact information: [place] [address] [phone]           Next level of care provider has access to [place] and Suicide Prevention discussed: Yes,  Completed with patient. Patient refused family contact  Have you used any form of tobacco in the last 30 days? (Cigarettes, Smokeless Tobacco, Cigars, and/or Pipes): No  Has patient been referred to the Quitline?: N/A patient is not a smoker  Patient has been referred for addiction treatment: N/A  Darin Engels, LCSW 05/23/2018, 9:24 AM

## 2018-07-28 ENCOUNTER — Emergency Department
Admission: EM | Admit: 2018-07-28 | Discharge: 2018-07-28 | Disposition: A | Discharge status: Other Acute Inpatient Hospital | Payer: Self-pay | Attending: Emergency Medicine | Admitting: Emergency Medicine

## 2018-07-28 ENCOUNTER — Other Ambulatory Visit: Payer: Self-pay

## 2018-07-28 ENCOUNTER — Emergency Department: Payer: Self-pay

## 2018-07-28 DIAGNOSIS — Z79899 Other long term (current) drug therapy: Secondary | ICD-10-CM | POA: Insufficient documentation

## 2018-07-28 DIAGNOSIS — E119 Type 2 diabetes mellitus without complications: Secondary | ICD-10-CM | POA: Insufficient documentation

## 2018-07-28 DIAGNOSIS — J45909 Unspecified asthma, uncomplicated: Secondary | ICD-10-CM | POA: Insufficient documentation

## 2018-07-28 DIAGNOSIS — R1030 Lower abdominal pain, unspecified: Secondary | ICD-10-CM | POA: Insufficient documentation

## 2018-07-28 DIAGNOSIS — R112 Nausea with vomiting, unspecified: Secondary | ICD-10-CM | POA: Insufficient documentation

## 2018-07-28 DIAGNOSIS — R1900 Intra-abdominal and pelvic swelling, mass and lump, unspecified site: Secondary | ICD-10-CM

## 2018-07-28 DIAGNOSIS — R350 Frequency of micturition: Secondary | ICD-10-CM | POA: Insufficient documentation

## 2018-07-28 DIAGNOSIS — Z7982 Long term (current) use of aspirin: Secondary | ICD-10-CM | POA: Insufficient documentation

## 2018-07-28 DIAGNOSIS — Z7984 Long term (current) use of oral hypoglycemic drugs: Secondary | ICD-10-CM | POA: Insufficient documentation

## 2018-07-28 LAB — LIPASE, BLOOD: LIPASE: 27 U/L (ref 11–51)

## 2018-07-28 LAB — URINALYSIS, COMPLETE (UACMP) WITH MICROSCOPIC
BACTERIA UA: NONE SEEN
BILIRUBIN URINE: NEGATIVE
GLUCOSE, UA: 50 mg/dL — AB
HGB URINE DIPSTICK: NEGATIVE
Ketones, ur: NEGATIVE mg/dL
LEUKOCYTES UA: NEGATIVE
NITRITE: NEGATIVE
PROTEIN: 100 mg/dL — AB
SPECIFIC GRAVITY, URINE: 1.012 (ref 1.005–1.030)
pH: 5 (ref 5.0–8.0)

## 2018-07-28 LAB — CBC
HCT: 33.2 % — ABNORMAL LOW (ref 36.0–46.0)
HEMOGLOBIN: 11.3 g/dL — AB (ref 12.0–15.0)
MCH: 27.1 pg (ref 26.0–34.0)
MCHC: 34 g/dL (ref 30.0–36.0)
MCV: 79.6 fL — ABNORMAL LOW (ref 80.0–100.0)
Platelets: 379 10*3/uL (ref 150–400)
RBC: 4.17 MIL/uL (ref 3.87–5.11)
RDW: 12.6 % (ref 11.5–15.5)
WBC: 13.1 10*3/uL — ABNORMAL HIGH (ref 4.0–10.5)
nRBC: 0 % (ref 0.0–0.2)

## 2018-07-28 LAB — COMPREHENSIVE METABOLIC PANEL
ALT: 15 U/L (ref 0–44)
AST: 16 U/L (ref 15–41)
Albumin: 3.3 g/dL — ABNORMAL LOW (ref 3.5–5.0)
Alkaline Phosphatase: 68 U/L (ref 38–126)
Anion gap: 9 (ref 5–15)
BUN: 18 mg/dL (ref 6–20)
CALCIUM: 8.5 mg/dL — AB (ref 8.9–10.3)
CO2: 22 mmol/L (ref 22–32)
Chloride: 105 mmol/L (ref 98–111)
Creatinine, Ser: 0.86 mg/dL (ref 0.44–1.00)
GFR calc Af Amer: >60 mL/min (ref >60)
Glucose, Bld: 258 mg/dL — ABNORMAL HIGH (ref 70–99)
Potassium: 4.2 mmol/L (ref 3.5–5.1)
Sodium: 136 mmol/L (ref 135–145)
Total Bilirubin: 0.5 mg/dL (ref 0.3–1.2)
Total Protein: 7 g/dL (ref 6.5–8.1)

## 2018-07-28 MED ORDER — PHENAZOPYRIDINE HCL 200 MG PO TABS
200.0000 mg | ORAL_TABLET | Freq: Once | ORAL | Status: AC
  Administered 2018-07-28: 200 mg via ORAL
  Filled 2018-07-28: qty 1

## 2018-07-28 MED ORDER — FENTANYL CITRATE (PF) 100 MCG/2ML IJ SOLN
50.0000 ug | Freq: Once | INTRAMUSCULAR | Status: AC
  Administered 2018-07-28: 50 ug via INTRAVENOUS
  Filled 2018-07-28: qty 2

## 2018-07-28 MED ORDER — SODIUM CHLORIDE 0.9 % IV BOLUS
1000.0000 mL | Freq: Once | INTRAVENOUS | Status: AC
  Administered 2018-07-28: 1000 mL via INTRAVENOUS

## 2018-07-28 MED ORDER — KETOROLAC TROMETHAMINE 30 MG/ML IJ SOLN
30.0000 mg | Freq: Once | INTRAMUSCULAR | Status: AC
  Administered 2018-07-28: 30 mg via INTRAVENOUS
  Filled 2018-07-28: qty 1

## 2018-07-28 MED ORDER — KETOROLAC TROMETHAMINE 60 MG/2ML IM SOLN
60.0000 mg | Freq: Once | INTRAMUSCULAR | Status: DC
  Filled 2018-07-28: qty 2

## 2018-07-28 NOTE — ED Notes (Signed)
Pt up to BR at this time.

## 2018-07-28 NOTE — ED Notes (Signed)
EMTALA reviewed

## 2018-07-28 NOTE — ED Provider Notes (Addendum)
[facility] Emergency Department Provider Note  Time seen: 7:12 AM  I have reviewed the triage vital signs and the nursing notes.   HISTORY  Chief Complaint Back Pain and Urinary Frequency    HPI Wendy Frazier is a 48 y.o. female with a past medical history of asthma, diabetes, hyperlipidemia, presents to the emergency department for right flank pain and urinary frequency.  According to the patient for the past 1 days she has been experiencing pain in her lower abdomen with urinary frequency but very little urine comes out per patient.  States she now is experiencing pain into her right side/right flank.  Patient states nausea with 1 episode of vomiting this morning.  No diarrhea.  No fever.  No hematuria.   Past Medical History:  Diagnosis Date   Asthma    Diabetes mellitus without complication ([place])    History of venomous spider bite 2015   Hospital inpatient tx for 1 week.   Hyperlipidemia    Migraines     Patient Active Problem List   Diagnosis Date Noted   Asthma 05/20/2018   Severe recurrent major depression without psychotic features ([place]) 05/20/2018   Suicidal ideation 05/20/2018   Diabetes ([place]) 03/30/2015   Anemia 03/23/2015   Chest pain 07/28/2014    History reviewed. No pertinent surgical history.  Prior to Admission medications   Medication Sig Start Date End Date Taking? Authorizing Provider  albuterol (PROVENTIL HFA;VENTOLIN HFA) 108 (90 Base) MCG/ACT inhaler Inhale 2 puffs into the lungs every 6 (six) hours as needed for wheezing or shortness of breath. 07/24/17   Laban Emperor, PA-C  aspirin EC 325 MG tablet Take 1 tablet (325 mg total) by mouth daily. 08/28/17 08/28/18  Earleen Newport, MD  glimepiride (AMARYL) 2 MG tablet Take 1 tablet (2 mg total) by mouth daily with breakfast. 05/23/18   McNew, Tyson Babinski, MD  metFORMIN (GLUCOPHAGE XR) 500 MG 24 hr tablet Take 2 tablets (1,000 mg total) by mouth daily with breakfast. 05/23/18  05/23/19  McNew, Tyson Babinski, MD  pantoprazole (PROTONIX) 40 MG tablet Take 1 tablet (40 mg total) by mouth daily. 04/02/18 04/02/19  Harvest Dark, MD  sertraline (ZOLOFT) 50 MG tablet Take 1 tablet (50 mg total) by mouth daily. 05/23/18   McNew, Tyson Babinski, MD    Allergies  Allergen Reactions   Morphine And Related Other (See Comments)    confusion    Family History  Problem Relation Age of Onset   Cancer Mother        Breast   Diabetes Father    Aneurysm Father        Brain    Social History Social History   Tobacco Use   Smoking status: Never Smoker   Smokeless tobacco: Never Used  Substance Use Topics   Alcohol use: No   Drug use: No    Review of Systems Constitutional: Negative for fever. Cardiovascular: Negative for chest pain. Respiratory: Negative for shortness of breath. Gastrointestinal: Right flank pain.  Positive for nausea vomiting x1.  Negative for diarrhea Genitourinary: Positive for urinary frequency.  No dysuria. Musculoskeletal: Negative for musculoskeletal complaints Skin: Negative for skin complaints  Neurological: Negative for headache All other ROS negative  ____________________________________________   PHYSICAL EXAM:  VITAL SIGNS: ED Triage Vitals  Enc Vitals Group     BP 07/28/18 0621 (!) 126/99     Pulse --      Resp 07/28/18 0621 20     Temp  07/28/18 0621 98.4 F (36.9 C)     Temp Source 07/28/18 0621 Oral     SpO2 07/28/18 0621 98 %     Weight 07/28/18 0622 192 lb (87.1 kg)     Height 07/28/18 0622 5\' 4"  (1.626 m)     Head Circumference --      Peak Flow --      Pain Score 07/28/18 0622 10     Pain Loc --      Pain Edu? --      Excl. in [place]? --    Constitutional: Alert and oriented. Well appearing and in no distress. Eyes: Normal exam ENT   Head: Normocephalic and atraumatic.   Mouth/Throat: Mucous membranes are moist. Cardiovascular: Normal rate, regular rhythm. No murmur Respiratory: Normal respiratory  effort without tachypnea nor retractions. Breath sounds are clear Gastrointestinal: Soft, fairly diffuse mild tenderness to palpation without rebound or guarding.  No distention. Musculoskeletal: Nontender with normal range of motion in all extremities. Neurologic:  Normal speech and language. No gross focal neurologic deficits are appreciated. Skin:  Skin is warm, dry and intact.  Psychiatric: Mood and affect are normal.   ____________________________________________    RADIOLOGY  IMPRESSION: 1. 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 The differential diagnosis does include a rapidly enlarged exophytic fibroid with necrosis or uterine leiomyosarcoma, though there was no dorsal fundal abnormality on the previous study. I think the small uterine abnormality previously described is at the inferior fundal corner on the left and is probably unchanged. Surgery may be considered given the rapid enlargement and either hemorrhage or necrosis. 2. Free intraperitoneal fluid as above which could represent a combination of hemorrhage and/or fluid related to the underlying lesion. 3. 11 mm left adrenal adenoma, unchanged. 4. These results were called by telephone at the time   ____________________________________________   INITIAL IMPRESSION / [place] / ED COURSE  Pertinent labs & imaging results that were available during my care of the patient were reviewed by me and considered in my medical decision making (see chart for details).  Patient presents to the emergency department complaining of lower abdominal discomfort urinary frequency and now right flank pain.  Nausea with one episode of vomiting.  Differential would include ureterolithiasis, urinary tract infection, pyelonephritis,  intra-abdominal pathology.  We will obtain CT imaging the abdomen/pelvis to further evaluate, check labs, urine treat with Toradol, Pyridium, IV fluids and continue to closely monitor.  Patient CT has resulted unfortunately shows a large pelvic mass with internal necrosis/hemorrhage possibly some leakage as well.  Given these findings I have discussed the patient with Dr. Amalia Hailey of OB/GYN who will be down to see the patient.  Anticipate the need for likely admission.  Patient states her pain is currently a 4/10 after medication.  Patient's blood work shows mild leukocytosis otherwise largely at the patient's baseline.  No significant urinary findings.  Dr. Amalia Hailey has seen the patient, believes the patient will be better served at [facility] which is also the patient's preference.  We will discuss with gynecology oncology at [facility] and arrange for transfer for the patient.  Discussed patient with [facility] gynecology/oncology.  Patient has been accepted by Dr. Rosebud Poles, Dr. Amalia Hailey has spoken to Dr. Fransisca Connors as well.  However unfortunately [facility] is currently on diversion.  Transfer center states realistically  they will not have a bed at all today.  I will discussed with Dr. Amalia Hailey to see if they can admit the patient locally for pain management and continued monitoring while awaiting transfer to [facility].  Discussed the patient with [facility], they have accepted to their service and have a bed available for the patient.  We will cancel her [facility] transfer proceed with [facility] transfer.  Patient and family agreeable with this plan of care.  ____________________________________________   FINAL CLINICAL IMPRESSION(S) / ED DIAGNOSES  Flank pain Urinary frequency Pelvic mass   Harvest Dark, MD 07/28/18 7837    Harvest Dark, MD 07/28/18 1129

## 2018-07-28 NOTE — ED Triage Notes (Signed)
Patient with low back pain and pain in the lower abdomen at the bottom of the bladder. States the pain hit her out of no where and she is having difficulty standing up. Patient feels like she has to "pee all the time."

## 2018-07-28 NOTE — Consult Note (Signed)
Consult Note  Requesting Attending Physician :  Wendy Dark, MD Service Requesting Consult : ED  Assessment/Recommendations:  Wendy Frazier is a 47 y.o. female seen in consultation at the request of Wendy Dark, MD regarding worsening left-sided abdominal/pelvic/back pain.  Patient states that she has been having intermittent pain since August and she told her other physician about this but no imaging was performed.  It became significantly worse last night and she presented to the emergency department.  She currently states that with the pain medication she has been given in the emergency department her pain level is a 5. Her sister is with her and seems to do much of the talking and makes many of decisions for this patient.  The patient willingly defers to her sister.  1.  CT consistent with large ovarian mass possibly internally hemorrhagic/necrotic.  Ovarian malignancy cannot be excluded. 2.  Based on patient's current symptoms/condition and the appearance of the mass surgery seems indicated. 3.  Patient and her sister desire transfer to [facility] for GYN oncology services and possible debulking/lymph node surgery as needed.  They have declined surgery at [facility].  I have discussed the risks and benefits of surgery for pelvic mass in detail with the patient and her sister.  I believe based on the appearance that it is possible that it is ovarian cancer although I still believe that it is likely not.  I have discussed the risks and benefits of surgery at [facility] versus surgery at [facility] with GYN oncology.  Despite my discussion the patient wishes transfer to [place] "just in case" that it turns out to be ovarian cancer requiring lymph node excision and debulking.  I have discussed this case with Dr. Fransisca Connors at [facility] oncology.  Social History: Social History   Socioeconomic History   Marital status: Divorced    Spouse name: Not on file   Number of children: Not on file   Years of  education: Not on file   Highest education level: Not on file  Occupational History   Not on file  Social Needs   Financial resource strain: Not on file   Food insecurity:    Worry: Not on file    Inability: Not on file   Transportation needs:    Medical: Not on file    Non-medical: Not on file  Tobacco Use   Smoking status: Never Smoker   Smokeless tobacco: Never Used  Substance and Sexual Activity   Alcohol use: No   Drug use: No   Sexual activity: Not on file  Lifestyle   Physical activity:    Days per week: Not on file    Minutes per session: Not on file   Stress: Not on file  Relationships   Social connections:    Talks on phone: Not on file    Gets together: Not on file    Attends religious service: Not on file    Active member of club or organization: Not on file    Attends meetings of clubs or organizations: Not on file    Relationship status: Not on file  Other Topics Concern   Not on file  Social History Narrative   Not on file    Family History: family history includes Aneurysm in her father; Cancer in her mother; Diabetes in her father.  Review of Systems:   Objective : Abdominal exam: No rebound tenderness there is some left-sided guarding.  Patient diffusely uncomfortable abdominally.  See CT results  revealing 13 cm pelvic mass.  Vital signs in last 24 hours: Temp:  [98.4 F (36.9 C)] 98.4 F (36.9 C) (01/06 0621) Pulse Rate:  [103-121] 103 (01/06 0900) Resp:  [17-20] 17 (01/06 0900) BP: (126-157)/(83-99) 154/85 (01/06 0900) SpO2:  [95 %-100 %] 100 % (01/06 0900) Weight:  [87.1 kg] 87.1 kg (01/06 0622)  Intake/Output last 3 shifts: No intake/output data recorded.   Test Results Imaging: See CT results  Finis Bud, M.D. 07/28/2018 9:14 AM

## 2018-07-28 NOTE — ED Notes (Signed)
Patient transported to CT °

## 2018-07-28 NOTE — ED Notes (Signed)
Checked on pt. NAD. Pt wanting to be transferred to [place]. VSS.  No needs at this time.

## 2018-07-28 NOTE — ED Notes (Signed)
OBGYN consult at bedside.

## 2018-07-28 NOTE — ED Notes (Signed)
Call to [facility].  Transport has been called.  ETA unknown, will update [facility] once this is clear as they are making an OR time for pt

## 2018-07-28 NOTE — ED Notes (Signed)
Patient requested to provide urine specimen. Patient unable to provide specimen at this time. Cup provided.

## 2018-07-29 LAB — URINE CULTURE

## 2018-08-15 ENCOUNTER — Ambulatory Visit: Payer: Self-pay | Admitting: Pharmacy Technician

## 2018-08-15 DIAGNOSIS — Z79899 Other long term (current) drug therapy: Secondary | ICD-10-CM

## 2018-08-18 NOTE — Progress Notes (Signed)
Met with patient completed financial assistance application for [place] due to recent hospital visit.  Patient agreed to be responsible for gathering financial information and forwarding to appropriate department in [facility].    Completed [facility] application and contract.  Patient agreed to all terms of the [facility] contract.    Patient approved to receive medication assistance at [facility] as long as eligibility criteria continues to be met.    Provided patient with community resource material based on her particular needs.    [facility]

## 2019-01-02 IMAGING — CR DG CHEST 2V
2 series · 2 of 2 positions shown · non-contrast
Comparison: Chest x-ray of 07/24/2016

CLINICAL DATA: Short of breath, weakness, left-sided chest pain

EXAM:
CHEST - 2 VIEW

[chest pa]
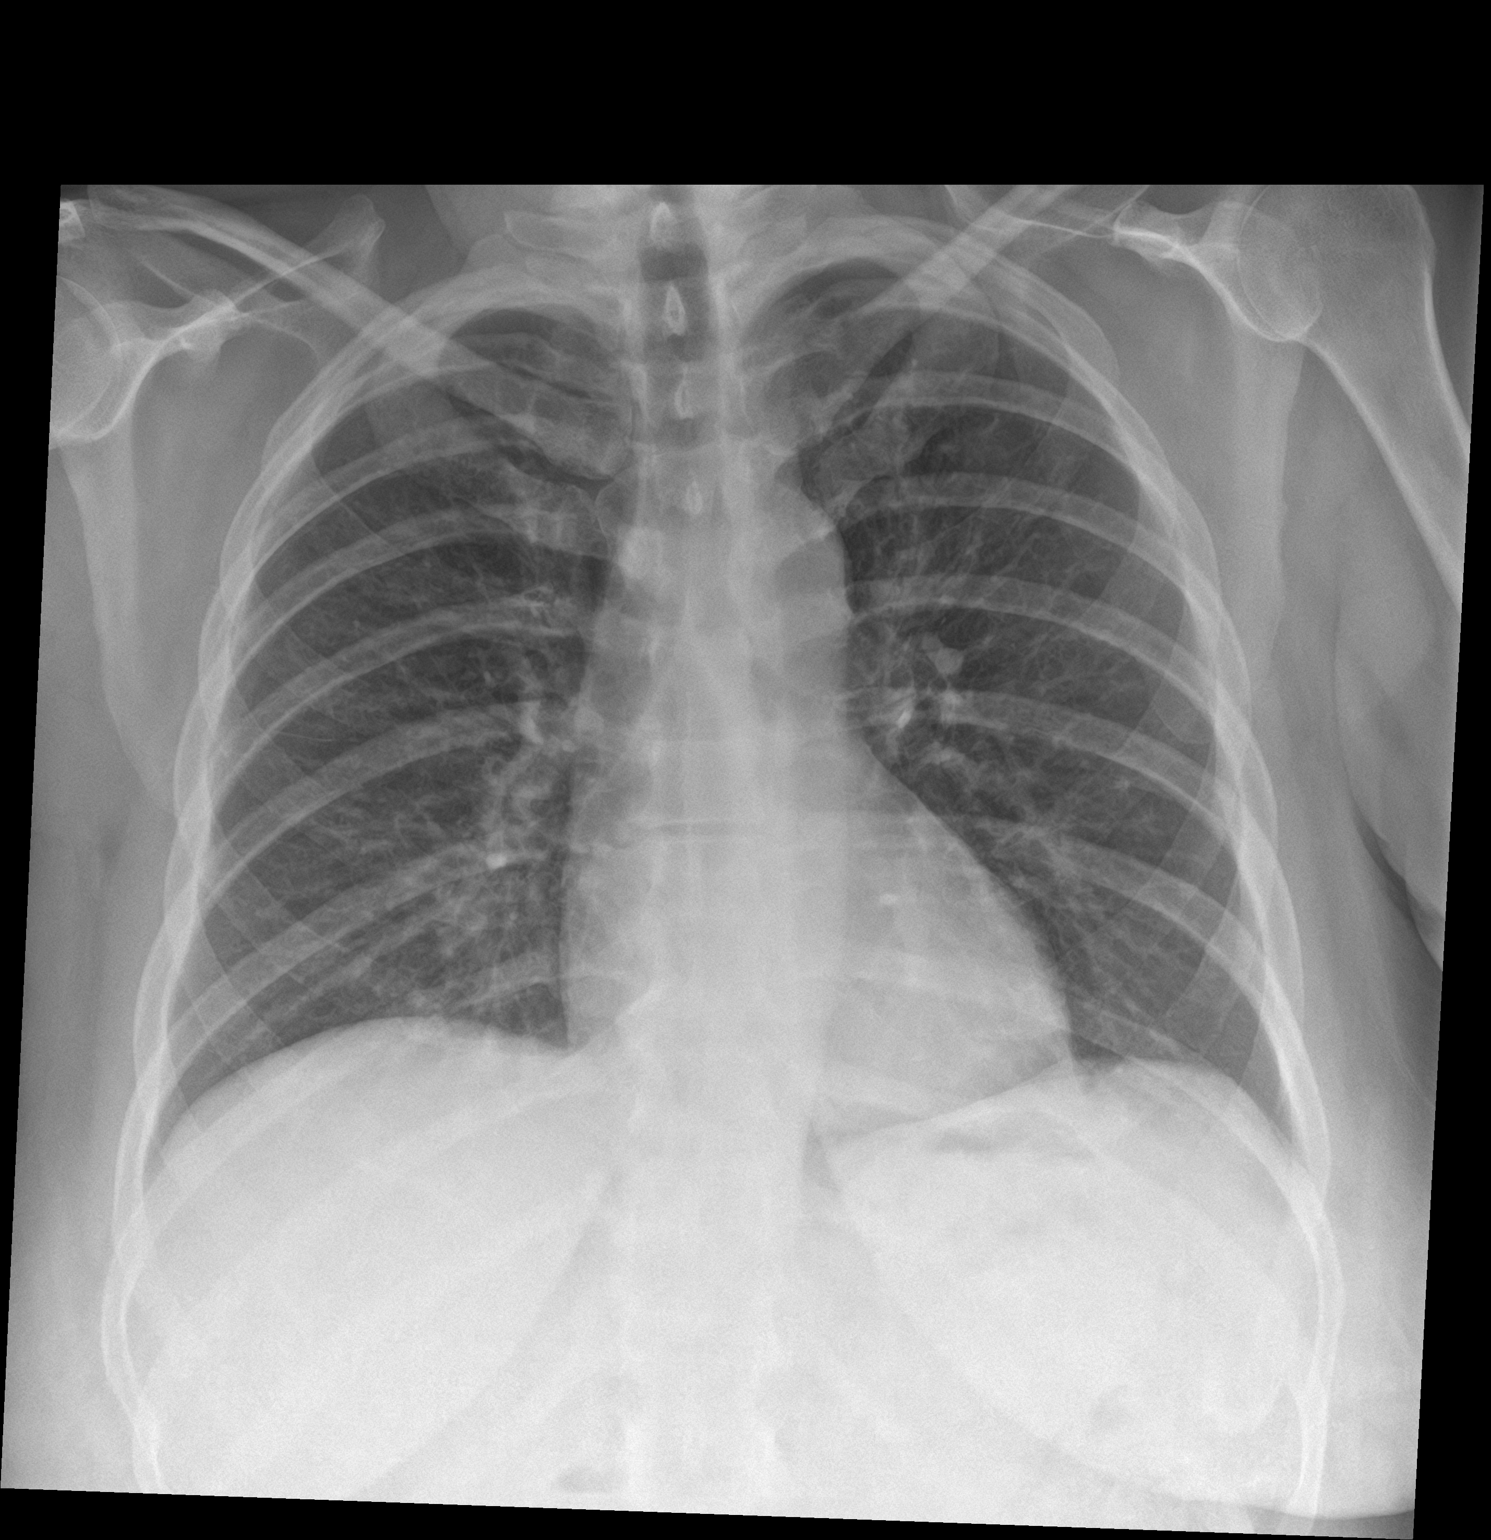

[chest lat]
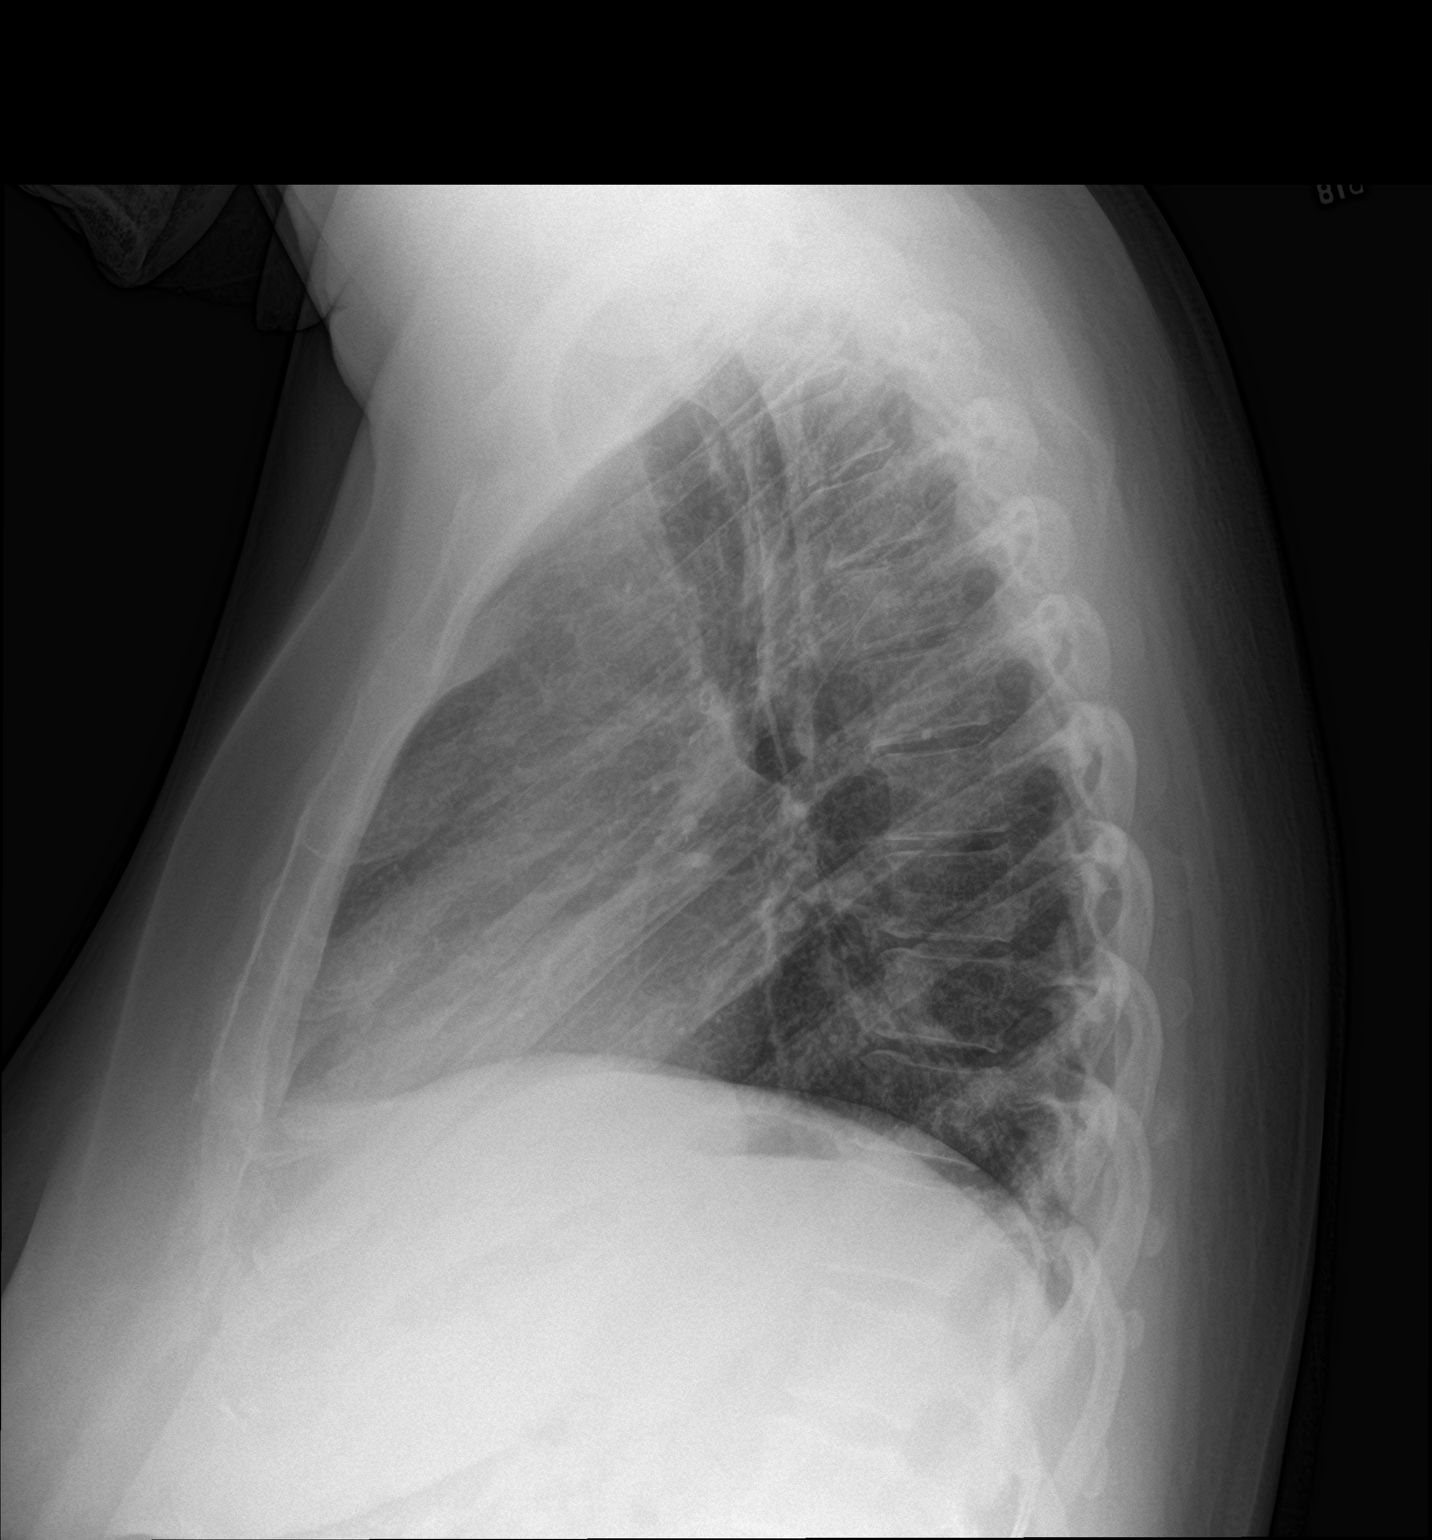

[2 of 2 positions shown; findings below may reference images not displayed]

FINDINGS: No active infiltrate or effusion is seen. Mediastinal and hilar
contours are unremarkable. The heart is within normal limits in
size. No bony abnormality is seen.
IMPRESSION: No active cardiopulmonary disease.

## 2019-05-19 ENCOUNTER — Other Ambulatory Visit: Payer: Self-pay | Admitting: *Deleted

## 2019-05-19 DIAGNOSIS — Z20822 Contact with and (suspected) exposure to covid-19: Secondary | ICD-10-CM

## 2019-05-21 LAB — NOVEL CORONAVIRUS, NAA: SARS-CoV-2, NAA: NOT DETECTED

## 2019-08-19 ENCOUNTER — Telehealth: Payer: Self-pay | Admitting: Pharmacy Technician

## 2019-08-19 NOTE — Telephone Encounter (Signed)
Patient has Medicaid with prescription coverage.  No longer meets the eligibility criteria of [facility].  Patient notified.  [facility]

## 2020-01-10 ENCOUNTER — Emergency Department: Payer: Medicaid Other

## 2020-01-10 ENCOUNTER — Emergency Department
Admission: EM | Admit: 2020-01-10 | Discharge: 2020-01-10 | Disposition: A | Discharge status: Home / Self Care | Payer: Medicaid Other | Attending: Emergency Medicine | Admitting: Emergency Medicine

## 2020-01-10 ENCOUNTER — Other Ambulatory Visit: Payer: Self-pay

## 2020-01-10 DIAGNOSIS — J45909 Unspecified asthma, uncomplicated: Secondary | ICD-10-CM | POA: Insufficient documentation

## 2020-01-10 DIAGNOSIS — E119 Type 2 diabetes mellitus without complications: Secondary | ICD-10-CM | POA: Insufficient documentation

## 2020-01-10 DIAGNOSIS — J189 Pneumonia, unspecified organism: Secondary | ICD-10-CM | POA: Diagnosis not present

## 2020-01-10 DIAGNOSIS — Z7984 Long term (current) use of oral hypoglycemic drugs: Secondary | ICD-10-CM | POA: Diagnosis not present

## 2020-01-10 DIAGNOSIS — Z79899 Other long term (current) drug therapy: Secondary | ICD-10-CM | POA: Diagnosis not present

## 2020-01-10 DIAGNOSIS — R05 Cough: Secondary | ICD-10-CM | POA: Diagnosis present

## 2020-01-10 LAB — BASIC METABOLIC PANEL
Anion gap: 12 (ref 5–15)
BUN: 26 mg/dL — ABNORMAL HIGH (ref 6–20)
CO2: 25 mmol/L (ref 22–32)
Calcium: 9.5 mg/dL (ref 8.9–10.3)
Chloride: 102 mmol/L (ref 98–111)
Creatinine, Ser: 0.74 mg/dL (ref 0.44–1.00)
GFR calc Af Amer: >60 mL/min (ref >60)
GFR calc non Af Amer: >60 mL/min (ref >60)
Glucose, Bld: 73 mg/dL (ref 70–99)
Potassium: 3.8 mmol/L (ref 3.5–5.1)
Sodium: 139 mmol/L (ref 135–145)

## 2020-01-10 LAB — CBC
HCT: 41 % (ref 36.0–46.0)
Hemoglobin: 13.9 g/dL (ref 12.0–15.0)
MCH: 28.1 pg (ref 26.0–34.0)
MCHC: 33.9 g/dL (ref 30.0–36.0)
MCV: 83 fL (ref 80.0–100.0)
Platelets: 283 10*3/uL (ref 150–400)
RBC: 4.94 MIL/uL (ref 3.87–5.11)
RDW: 13.7 % (ref 11.5–15.5)
WBC: 13.1 10*3/uL — ABNORMAL HIGH (ref 4.0–10.5)
nRBC: 0 % (ref 0.0–0.2)

## 2020-01-10 LAB — TROPONIN I (HIGH SENSITIVITY)
Troponin I (High Sensitivity): 5 ng/L (ref <18)
Troponin I (High Sensitivity): 5 ng/L (ref <18)

## 2020-01-10 MED ORDER — FLUCONAZOLE 150 MG PO TABS: 150.0000 mg | ORAL_TABLET | Freq: Every day | ORAL | 0 refills | Status: DC

## 2020-01-10 MED ORDER — DOXYCYCLINE HYCLATE 100 MG PO TABS: 100.0000 mg | ORAL_TABLET | Freq: Two times a day (BID) | ORAL | 0 refills | Status: DC

## 2020-01-10 MED ORDER — CEFDINIR 300 MG PO CAPS: 300.0000 mg | ORAL_CAPSULE | Freq: Two times a day (BID) | ORAL | 0 refills | Status: AC

## 2020-01-10 MED ORDER — CEFDINIR 300 MG PO CAPS: 300.0000 mg | ORAL_CAPSULE | Freq: Two times a day (BID) | ORAL | 0 refills | Status: DC

## 2020-01-10 MED ORDER — IPRATROPIUM-ALBUTEROL 0.5-2.5 (3) MG/3ML IN SOLN
3.0000 mL | Freq: Once | RESPIRATORY_TRACT | Status: AC
  Administered 2020-01-10: 3 mL via RESPIRATORY_TRACT
  Filled 2020-01-10: qty 3

## 2020-01-10 MED ORDER — CEFDINIR 300 MG PO CAPS
300.0000 mg | ORAL_CAPSULE | Freq: Two times a day (BID) | ORAL | Status: DC
  Administered 2020-01-10: 300 mg via ORAL
  Filled 2020-01-10 (×2): qty 1

## 2020-01-10 MED ORDER — DOXYCYCLINE HYCLATE 100 MG PO TABS
100.0000 mg | ORAL_TABLET | Freq: Once | ORAL | Status: AC
  Administered 2020-01-10: 100 mg via ORAL
  Filled 2020-01-10: qty 1

## 2020-01-10 NOTE — ED Triage Notes (Signed)
Pt A&O, in wheelchair. States L foot swelling and wound. Has bandage on it. Hx DM. States here today for cough. Denies fever.

## 2020-01-10 NOTE — ED Notes (Signed)
Wound care and dressing applied to left foot.

## 2020-01-10 NOTE — ED Provider Notes (Signed)
[facility] Emergency Department Provider Note   ____________________________________________    I have reviewed the triage vital signs and the nursing notes.   HISTORY  Chief Complaint Cough and Foot Pain     HPI Wendy Frazier is a 49 y.o. female with a history of diabetes presents with complaints of cough which she has had for several weeks.  She denies fevers or chills but occasionally does feel warm.  She reports it is a dry cough, initially was productive, but seems to be improving somewhat.  Denies shortness of breath.  No calf pain or swelling.  No nausea or vomiting.  No diaphoresis.  Reports that she was put on a Z-Pak which she finished a week ago without improvement.  Review of medical records demonstrates the patient did have a CT scan on June 1 which showed a lower lobe pneumonia.  Past Medical History:  Diagnosis Date  . Asthma   . Diabetes mellitus without complication ([place])   . History of venomous spider bite 2015   Hospital inpatient tx for 1 week.  . Hyperlipidemia   . Migraines     Patient Active Problem List   Diagnosis Date Noted  . Asthma 05/20/2018  . Severe recurrent major depression without psychotic features ([place]) 05/20/2018  . Suicidal ideation 05/20/2018  . Diabetes ([place]) 03/30/2015  . Anemia 03/23/2015  . Chest pain 07/28/2014    Past Surgical History:  Procedure Laterality Date  . ABDOMINAL HYSTERECTOMY      Prior to Admission medications   Medication Sig Start Date End Date Taking? Authorizing Provider  cefdinir (OMNICEF) 300 MG capsule Take 1 capsule (300 mg total) by mouth 2 (two) times daily for 7 days. 01/10/20 01/17/20  Lavonia Drafts, MD  doxycycline (VIBRA-TABS) 100 MG tablet Take 1 tablet (100 mg total) by mouth 2 (two) times daily. 01/10/20   Lavonia Drafts, MD  fluconazole (DIFLUCAN) 150 MG tablet Take 1 tablet (150 mg total) by mouth daily. 01/10/20   Lavonia Drafts, MD  glimepiride (AMARYL) 2 MG tablet  Take 1 tablet (2 mg total) by mouth daily with breakfast. 05/23/18   McNew, Tyson Babinski, MD  metFORMIN (GLUCOPHAGE XR) 500 MG 24 hr tablet Take 2 tablets (1,000 mg total) by mouth daily with breakfast. 05/23/18 05/23/19  McNew, Tyson Babinski, MD  sertraline (ZOLOFT) 50 MG tablet Take 1 tablet (50 mg total) by mouth daily. 05/23/18   McNew, Tyson Babinski, MD     Allergies Morphine and related  Family History  Problem Relation Age of Onset  . Cancer Mother        Breast  . Diabetes Father   . Aneurysm Father        Brain    Social History Social History   Tobacco Use  . Smoking status: Never Smoker  . Smokeless tobacco: Never Used  Substance Use Topics  . Alcohol use: No  . Drug use: No    Review of Systems  Constitutional: As above Eyes: No visual changes.  ENT: No sore throat. Cardiovascular: Denies chest pain. Respiratory: As above Gastrointestinal: No abdominal pain.  No nausea, no vomiting.   Genitourinary: Negative for dysuria. Musculoskeletal: Negative for back pain. Skin: Negative for rash. Neurological: Negative for headaches or weakness   ____________________________________________   PHYSICAL EXAM:  VITAL SIGNS: ED Triage Vitals  Enc Vitals Group     BP 01/10/20 1101 120/77     Pulse Rate 01/10/20 1101 (!) 112  Resp 01/10/20 1101 18     Temp 01/10/20 1101 99.7 F (37.6 C)     Temp Source 01/10/20 1101 Oral     SpO2 01/10/20 1101 98 %     Weight 01/10/20 1100 65.8 kg (145 lb)     Height 01/10/20 1100 1.626 m (5\' 4" )     Head Circumference --      Peak Flow --      Pain Score 01/10/20 1100 0     Pain Loc --      Pain Edu? --      Excl. in [place]? --     Constitutional: Alert and oriented.  Eyes: Conjunctivae are normal.  . Nose: No congestion/rhinnorhea. Mouth/Throat: Mucous membranes are moist.    Cardiovascular: Normal rate, regular rhythm. Grossly normal heart sounds.  Good peripheral circulation. Respiratory: Normal respiratory effort.  No retractions.   Bibasilar rales Gastrointestinal: Soft and nontender. No distention.  No CVA tenderness.  Musculoskeletal: .  Warm and well perfused Neurologic:  Normal speech and language. No gross focal neurologic deficits are appreciated.  Skin:  Skin is warm, dry and intact.  Mild swelling to the left foot, opened blister noted on the medial aspect of the forefoot, no evidence of infection Psychiatric: Mood and affect are normal. Speech and behavior are normal.  ____________________________________________   LABS (all labs ordered are listed, but only abnormal results are displayed)  Labs Reviewed  BASIC METABOLIC PANEL - Abnormal; Notable for the following components:      Result Value   BUN 26 (*)    All other components within normal limits  CBC - Abnormal; Notable for the following components:   WBC 13.1 (*)    All other components within normal limits  TROPONIN I (HIGH SENSITIVITY)  TROPONIN I (HIGH SENSITIVITY)   ____________________________________________  EKG  ED ECG REPORT I, Lavonia Drafts, the attending physician, personally viewed and interpreted this ECG.  Date: 01/10/2020  Rhythm: Sinus tachycardia QRS Axis: normal Intervals: normal ST/T Wave abnormalities: normal Narrative Interpretation: no evidence of acute ischemia  ____________________________________________  RADIOLOGY  Chest x-ray today reviewed by me, no evidence of infiltrate ____________________________________________   PROCEDURES  Procedure(s) performed: No  Procedures   Critical Care performed: No ____________________________________________   INITIAL IMPRESSION / ASSESSMENT AND PLAN / ED COURSE  Pertinent labs & imaging results that were available during my care of the patient were reviewed by me and considered in my medical decision making (see chart for details).  Patient presents with cough and is her primary concern.  Reviewed medical records, [facility] performed CT scan on June 1 which  showed a lower lobe pneumonia, was put on a Z-Pak with some improvement.  I suspect patient has continued pneumonia despite reassuring x-ray today.  I recommended admission given her elevated white blood cell count (which may be due to her steroid infusions) however the patient reports that she spent 4 weeks in the hospital this year and does not want to spend another night in the hospital.  She adamantly refuses admission.   given the above we discussed starting her on 2 stronger antibiotics which is what she has agreed to.  She knows that she can return immediately if any worsening      ____________________________________________   FINAL CLINICAL IMPRESSION(S) / ED DIAGNOSES  Final diagnoses:  Community acquired pneumonia, unspecified laterality        Note:  This document was prepared using Systems analyst and may include unintentional dictation  errors.   Lavonia Drafts, MD 01/10/20 [phone]

## 2020-01-10 NOTE — ED Notes (Signed)
phlebotomy with pt to redraw blood

## 2020-01-17 ENCOUNTER — Emergency Department
Admission: EM | Admit: 2020-01-17 | Discharge: 2020-01-17 | Disposition: A | Discharge status: Home / Self Care | Payer: Medicaid Other | Attending: Emergency Medicine | Admitting: Emergency Medicine

## 2020-01-17 ENCOUNTER — Other Ambulatory Visit: Payer: Self-pay

## 2020-01-17 ENCOUNTER — Emergency Department: Payer: Medicaid Other

## 2020-01-17 DIAGNOSIS — Z7984 Long term (current) use of oral hypoglycemic drugs: Secondary | ICD-10-CM | POA: Diagnosis not present

## 2020-01-17 DIAGNOSIS — R05 Cough: Secondary | ICD-10-CM | POA: Diagnosis present

## 2020-01-17 DIAGNOSIS — R0602 Shortness of breath: Secondary | ICD-10-CM | POA: Diagnosis not present

## 2020-01-17 DIAGNOSIS — E119 Type 2 diabetes mellitus without complications: Secondary | ICD-10-CM | POA: Insufficient documentation

## 2020-01-17 DIAGNOSIS — J45909 Unspecified asthma, uncomplicated: Secondary | ICD-10-CM | POA: Insufficient documentation

## 2020-01-17 DIAGNOSIS — R0600 Dyspnea, unspecified: Secondary | ICD-10-CM | POA: Diagnosis not present

## 2020-01-17 LAB — CBC
HCT: 31.5 % — ABNORMAL LOW (ref 36.0–46.0)
Hemoglobin: 11.1 g/dL — ABNORMAL LOW (ref 12.0–15.0)
MCH: 28.3 pg (ref 26.0–34.0)
MCHC: 35.2 g/dL (ref 30.0–36.0)
MCV: 80.4 fL (ref 80.0–100.0)
Platelets: 281 10*3/uL (ref 150–400)
RBC: 3.92 MIL/uL (ref 3.87–5.11)
RDW: 13.2 % (ref 11.5–15.5)
WBC: 10 10*3/uL (ref 4.0–10.5)
nRBC: 0 % (ref 0.0–0.2)

## 2020-01-17 LAB — BASIC METABOLIC PANEL
Anion gap: 8 (ref 5–15)
BUN: 37 mg/dL — ABNORMAL HIGH (ref 6–20)
CO2: 25 mmol/L (ref 22–32)
Calcium: 8.7 mg/dL — ABNORMAL LOW (ref 8.9–10.3)
Chloride: 100 mmol/L (ref 98–111)
Creatinine, Ser: 0.86 mg/dL (ref 0.44–1.00)
GFR calc Af Amer: >60 mL/min (ref >60)
GFR calc non Af Amer: >60 mL/min (ref >60)
Glucose, Bld: 359 mg/dL — ABNORMAL HIGH (ref 70–99)
Potassium: 4 mmol/L (ref 3.5–5.1)
Sodium: 133 mmol/L — ABNORMAL LOW (ref 135–145)

## 2020-01-17 LAB — TROPONIN I (HIGH SENSITIVITY)
Troponin I (High Sensitivity): 6 ng/L (ref <18)
Troponin I (High Sensitivity): 5 ng/L (ref <18)

## 2020-01-17 MED ORDER — IOHEXOL 350 MG/ML SOLN
75.0000 mL | Freq: Once | INTRAVENOUS | Status: AC | PRN
  Administered 2020-01-17: 75 mL via INTRAVENOUS

## 2020-01-17 MED ORDER — GUAIFENESIN-CODEINE 100-10 MG/5ML PO SOLN: 5.0000 mL | Freq: Four times a day (QID) | ORAL | 0 refills | Status: AC | PRN

## 2020-01-17 MED ORDER — PREDNISONE 20 MG PO TABS: 40.0000 mg | ORAL_TABLET | Freq: Every day | ORAL | 0 refills | Status: AC

## 2020-01-17 NOTE — Discharge Instructions (Signed)
Please call the number provided for pulmonology to arrange a follow-up appointment for further evaluation.  Please take your course of steroids.  Please use your cough medication as needed, as written.  Return to the emergency department for any chest pain worsening shortness of breath or any other symptom personally concerning to yourself.

## 2020-01-17 NOTE — ED Triage Notes (Signed)
Pt comes into the ED via wheelchair from home with c/o increased SOB and continued dry cough, pt was seen her 6/20 and they tried to admit her for PNE but pt signed out AMA. Pt is in NAD on arrival.

## 2020-01-17 NOTE — ED Provider Notes (Signed)
[facility] Emergency Department Provider Note  Time seen: 2:24 PM  I have reviewed the triage vital signs and the nursing notes.   HISTORY  Chief Complaint Cough, shortness of breath   HPI Wendy Frazier is a 49 y.o. female with a past medical history of diabetes, hyperlipidemia, lower extremity weakness/spasticity, presents to the emergency department for cough and shortness of breath.  According to the patient over the past 1 month she has had a dry cough along with shortness of breath.  Patient states she was seen by her doctor and started on Z-Pak.  Was seen in the emergency department and given a different antibiotic.  States she was diagnosed with possible pneumonia.  Patient states her symptoms have not improved so she came to the emergency department today for repeat evaluation.  Patient states the shortness of breath is mostly due to dry coughing.  Denies any sputum production.  No fever at any point per patient.  No real chest pain.  Largely negative review of systems otherwise.   Past Medical History:  Diagnosis Date  . Asthma   . Diabetes mellitus without complication ([place])   . History of venomous spider bite 2015   Hospital inpatient tx for 1 week.  . Hyperlipidemia   . Migraines     Patient Active Problem List   Diagnosis Date Noted  . Asthma 05/20/2018  . Severe recurrent major depression without psychotic features ([place]) 05/20/2018  . Suicidal ideation 05/20/2018  . Diabetes ([place]) 03/30/2015  . Anemia 03/23/2015  . Chest pain 07/28/2014    Past Surgical History:  Procedure Laterality Date  . ABDOMINAL HYSTERECTOMY      Prior to Admission medications   Medication Sig Start Date End Date Taking? Authorizing Provider  cefdinir (OMNICEF) 300 MG capsule Take 1 capsule (300 mg total) by mouth 2 (two) times daily for 7 days. 01/10/20 01/17/20  Lavonia Drafts, MD  doxycycline (VIBRA-TABS) 100 MG tablet Take 1 tablet (100 mg total) by mouth 2 (two)  times daily. 01/10/20   Lavonia Drafts, MD  fluconazole (DIFLUCAN) 150 MG tablet Take 1 tablet (150 mg total) by mouth daily. 01/10/20   Lavonia Drafts, MD  glimepiride (AMARYL) 2 MG tablet Take 1 tablet (2 mg total) by mouth daily with breakfast. 05/23/18   McNew, Tyson Babinski, MD  metFORMIN (GLUCOPHAGE XR) 500 MG 24 hr tablet Take 2 tablets (1,000 mg total) by mouth daily with breakfast. 05/23/18 05/23/19  McNew, Tyson Babinski, MD  sertraline (ZOLOFT) 50 MG tablet Take 1 tablet (50 mg total) by mouth daily. 05/23/18   McNew, Tyson Babinski, MD    Allergies  Allergen Reactions  . Morphine And Related Other (See Comments)    confusion    Family History  Problem Relation Age of Onset  . Cancer Mother        Breast  . Diabetes Father   . Aneurysm Father        Brain    Social History Social History   Tobacco Use  . Smoking status: Never Smoker  . Smokeless tobacco: Never Used  Substance Use Topics  . Alcohol use: No  . Drug use: No    Review of Systems Constitutional: Negative for fever. Cardiovascular: Negative for chest pain. Respiratory: Positive for dry cough.  Positive shortness of breath. Gastrointestinal: Negative for abdominal pain Musculoskeletal: Negative for musculoskeletal complaints Neurological: Negative for headache All other ROS negative  ____________________________________________   PHYSICAL EXAM:  VITAL SIGNS: ED Triage Vitals  Enc Vitals Group     BP 01/17/20 1223 (!) 149/94     Pulse Rate 01/17/20 1223 93     Resp 01/17/20 1223 17     Temp 01/17/20 1223 98.6 F (37 C)     Temp Source 01/17/20 1223 Oral     SpO2 01/17/20 1223 98 %     Weight 01/17/20 1056 145 lb (65.8 kg)     Height 01/17/20 1056 5\' 4"  (1.626 m)     Head Circumference --      Peak Flow --      Pain Score 01/17/20 1347 0     Pain Loc --      Pain Edu? --      Excl. in [place]? --    Constitutional: Alert and oriented. Well appearing and in no distress. Eyes: Normal exam ENT      Head:  Normocephalic and atraumatic.      Mouth/Throat: Mucous membranes are moist. Cardiovascular: Normal rate, regular rhythm.  Respiratory: Normal respiratory effort without tachypnea nor retractions. Breath sounds are clear  Gastrointestinal: Soft and nontender. No distention.   Musculoskeletal: Nontender with normal range of motion in all extremities. Neurologic:  Normal speech and language. No gross focal neurologic deficits Skin:  Skin is warm, dry and intact.  Psychiatric: Mood and affect are normal.   ____________________________________________    EKG  EKG viewed and interpreted by myself shows normal sinus rhythm at 91 bpm with a narrow QRS, normal axis, normal intervals, no concerning ST changes.  ____________________________________________    RADIOLOGY  X-ray is negative.  ____________________________________________   INITIAL IMPRESSION / ASSESSMENT AND PLAN / ED COURSE  Pertinent labs & imaging results that were available during my care of the patient were reviewed by me and considered in my medical decision making (see chart for details).   Patient presents to the emergency department for continued dry cough and shortness of breath.  Overall the patient appears well, clear lung sounds on my exam, reassuring labs including a negative troponin and normal white blood cell count.  Chest x-ray is negative.  EKG reassuring.  However given the patient's complaint of 1 month of symptoms that have not been improving we will proceed with CTA of the chest as the patient states a history of blood clot in the past.  Patient agreeable to plan of care.  CT angiography is negative for PE but does show an area in the right lower lung that could be either infectious or inflammatory opacity.  Patient has gone through 2 courses of antibiotics.  I do not believe a third course of antibiotics would be beneficial to the patient.  We will discharge with a short course of prednisone and have the  patient follow-up with pulmonology for further evaluation.  Patient will also follow-up with her PCP.  Patient agreeable to plan of care.  Wendy Frazier was evaluated in Emergency Department on 01/17/2020 for the symptoms described in the history of present illness. She was evaluated in the context of the global COVID-19 pandemic, which necessitated consideration that the patient might be at risk for infection with the SARS-CoV-2 virus that causes COVID-19. Institutional protocols and algorithms that pertain to the evaluation of patients at risk for COVID-19 are in a state of rapid change based on information released by regulatory bodies including the CDC and federal and state organizations. These policies and algorithms were followed during the patient's care in the ED.  ____________________________________________   FINAL CLINICAL IMPRESSION(S) /  ED DIAGNOSES  Dyspnea   Harvest Dark, MD 01/17/20 [phone]

## 2020-10-07 ENCOUNTER — Emergency Department
Admission: EM | Admit: 2020-10-07 | Discharge: 2020-10-08 | Disposition: A | Discharge status: Home / Self Care | Payer: Medicaid Other | Attending: Emergency Medicine | Admitting: Emergency Medicine

## 2020-10-07 ENCOUNTER — Other Ambulatory Visit: Payer: Self-pay

## 2020-10-07 ENCOUNTER — Emergency Department: Payer: Medicaid Other

## 2020-10-07 DIAGNOSIS — Z7984 Long term (current) use of oral hypoglycemic drugs: Secondary | ICD-10-CM | POA: Diagnosis not present

## 2020-10-07 DIAGNOSIS — J45909 Unspecified asthma, uncomplicated: Secondary | ICD-10-CM | POA: Insufficient documentation

## 2020-10-07 DIAGNOSIS — X501XXA Overexertion from prolonged static or awkward postures, initial encounter: Secondary | ICD-10-CM | POA: Insufficient documentation

## 2020-10-07 DIAGNOSIS — R739 Hyperglycemia, unspecified: Secondary | ICD-10-CM

## 2020-10-07 DIAGNOSIS — S92314A Nondisplaced fracture of first metatarsal bone, right foot, initial encounter for closed fracture: Secondary | ICD-10-CM | POA: Diagnosis not present

## 2020-10-07 DIAGNOSIS — M25579 Pain in unspecified ankle and joints of unspecified foot: Secondary | ICD-10-CM

## 2020-10-07 DIAGNOSIS — Y92811 Bus as the place of occurrence of the external cause: Secondary | ICD-10-CM | POA: Insufficient documentation

## 2020-10-07 DIAGNOSIS — S99921A Unspecified injury of right foot, initial encounter: Secondary | ICD-10-CM | POA: Diagnosis present

## 2020-10-07 DIAGNOSIS — E1165 Type 2 diabetes mellitus with hyperglycemia: Secondary | ICD-10-CM | POA: Diagnosis not present

## 2020-10-07 DIAGNOSIS — Y9302 Activity, running: Secondary | ICD-10-CM | POA: Insufficient documentation

## 2020-10-07 LAB — CBG MONITORING, ED: Glucose-Capillary: 494 mg/dL — ABNORMAL HIGH (ref 70–99)

## 2020-10-07 MED ORDER — SODIUM CHLORIDE 0.9 % IV SOLN
1000.0000 mL | Freq: Once | INTRAVENOUS | Status: AC
  Administered 2020-10-08: 1000 mL via INTRAVENOUS

## 2020-10-07 MED ORDER — INSULIN ASPART 100 UNIT/ML ~~LOC~~ SOLN
10.0000 [IU] | Freq: Once | SUBCUTANEOUS | Status: AC
  Administered 2020-10-08: 10 [IU] via INTRAVENOUS
  Filled 2020-10-07: qty 1
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
## 2020-10-08 LAB — BASIC METABOLIC PANEL
Anion gap: 8 (ref 5–15)
BUN: 18 mg/dL (ref 6–20)
CO2: 25 mmol/L (ref 22–32)
Calcium: 8.8 mg/dL — ABNORMAL LOW (ref 8.9–10.3)
Chloride: 99 mmol/L (ref 98–111)
Creatinine, Ser: 1.03 mg/dL — ABNORMAL HIGH (ref 0.44–1.00)
GFR, Estimated: >60 mL/min (ref >60)
Glucose, Bld: 469 mg/dL — ABNORMAL HIGH (ref 70–99)
Potassium: 3.9 mmol/L (ref 3.5–5.1)
Sodium: 132 mmol/L — ABNORMAL LOW (ref 135–145)

## 2020-10-08 LAB — CBC
HCT: 35.7 % — ABNORMAL LOW (ref 36.0–46.0)
Hemoglobin: 12 g/dL (ref 12.0–15.0)
MCH: 27.6 pg (ref 26.0–34.0)
MCHC: 33.6 g/dL (ref 30.0–36.0)
MCV: 82.3 fL (ref 80.0–100.0)
Platelets: 240 10*3/uL (ref 150–400)
RBC: 4.34 MIL/uL (ref 3.87–5.11)
RDW: 13.4 % (ref 11.5–15.5)
WBC: 9.8 10*3/uL (ref 4.0–10.5)
nRBC: 0 % (ref 0.0–0.2)

## 2020-10-08 LAB — CBG MONITORING, ED: Glucose-Capillary: 441 mg/dL — ABNORMAL HIGH (ref 70–99)

## 2020-10-08 MED ORDER — NAPROXEN 500 MG PO TABS: 500.0000 mg | ORAL_TABLET | Freq: Two times a day (BID) | ORAL | 2 refills | Status: AC

## 2020-10-08 MED ORDER — KETOROLAC TROMETHAMINE 30 MG/ML IJ SOLN
30.0000 mg | Freq: Once | INTRAMUSCULAR | Status: AC
  Administered 2020-10-08: 30 mg via INTRAVENOUS
  Filled 2020-10-08: qty 1

## 2020-10-08 NOTE — ED Notes (Signed)
Labs attempted by this RN and Theodoro Kos without success. Lab notified and will send someone.
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
## 2021-01-03 ENCOUNTER — Other Ambulatory Visit: Payer: Self-pay

## 2021-01-04 ENCOUNTER — Other Ambulatory Visit: Payer: Self-pay

## 2021-01-04 MED ORDER — NITROFURANTOIN MACROCRYSTAL 100 MG PO CAPS: ORAL_CAPSULE | ORAL | 0 refills | Status: DC

## 2021-01-05 ENCOUNTER — Other Ambulatory Visit: Payer: Self-pay

## 2021-01-05 MED ORDER — NITROFURANTOIN MONOHYD MACRO 100 MG PO CAPS
ORAL_CAPSULE | ORAL | 0 refills | Status: DC
  Filled 2021-01-05: qty 14, 7d supply, fill #0

## 2021-02-09 ENCOUNTER — Other Ambulatory Visit: Payer: Self-pay

## 2021-03-08 ENCOUNTER — Emergency Department
Admission: EM | Admit: 2021-03-08 | Discharge: 2021-03-08 | Disposition: A | Discharge status: Home / Self Care | Payer: Medicaid Other | Attending: Emergency Medicine | Admitting: Emergency Medicine

## 2021-03-08 ENCOUNTER — Emergency Department: Payer: Medicaid Other

## 2021-03-08 ENCOUNTER — Other Ambulatory Visit: Payer: Self-pay

## 2021-03-08 DIAGNOSIS — U071 COVID-19: Secondary | ICD-10-CM | POA: Diagnosis not present

## 2021-03-08 DIAGNOSIS — E1169 Type 2 diabetes mellitus with other specified complication: Secondary | ICD-10-CM | POA: Diagnosis not present

## 2021-03-08 DIAGNOSIS — A084 Viral intestinal infection, unspecified: Secondary | ICD-10-CM

## 2021-03-08 DIAGNOSIS — Z7984 Long term (current) use of oral hypoglycemic drugs: Secondary | ICD-10-CM | POA: Insufficient documentation

## 2021-03-08 DIAGNOSIS — J45909 Unspecified asthma, uncomplicated: Secondary | ICD-10-CM | POA: Insufficient documentation

## 2021-03-08 DIAGNOSIS — R0981 Nasal congestion: Secondary | ICD-10-CM | POA: Diagnosis present

## 2021-03-08 LAB — LIPASE, BLOOD: Lipase: 27 U/L (ref 11–51)

## 2021-03-08 LAB — CBC
HCT: 36.8 % (ref 36.0–46.0)
Hemoglobin: 12.5 g/dL (ref 12.0–15.0)
MCH: 27.8 pg (ref 26.0–34.0)
MCHC: 34 g/dL (ref 30.0–36.0)
MCV: 81.8 fL (ref 80.0–100.0)
Platelets: 259 10*3/uL (ref 150–400)
RBC: 4.5 MIL/uL (ref 3.87–5.11)
RDW: 13.2 % (ref 11.5–15.5)
WBC: 4.7 10*3/uL (ref 4.0–10.5)
nRBC: 0 % (ref 0.0–0.2)

## 2021-03-08 LAB — BASIC METABOLIC PANEL
Anion gap: 9 (ref 5–15)
BUN: 15 mg/dL (ref 6–20)
CO2: 22 mmol/L (ref 22–32)
Calcium: 8.8 mg/dL — ABNORMAL LOW (ref 8.9–10.3)
Chloride: 107 mmol/L (ref 98–111)
Creatinine, Ser: 1.13 mg/dL — ABNORMAL HIGH (ref 0.44–1.00)
GFR, Estimated: 59 mL/min — ABNORMAL LOW (ref >60)
Glucose, Bld: 138 mg/dL — ABNORMAL HIGH (ref 70–99)
Potassium: 3.6 mmol/L (ref 3.5–5.1)
Sodium: 138 mmol/L (ref 135–145)

## 2021-03-08 LAB — HEPATIC FUNCTION PANEL
ALT: 17 U/L (ref 0–44)
AST: 22 U/L (ref 15–41)
Albumin: 3.6 g/dL (ref 3.5–5.0)
Alkaline Phosphatase: 76 U/L (ref 38–126)
Bilirubin, Direct: <0.1 mg/dL (ref 0.0–0.2)
Total Bilirubin: 0.4 mg/dL (ref 0.3–1.2)
Total Protein: 7.5 g/dL (ref 6.5–8.1)

## 2021-03-08 LAB — TROPONIN I (HIGH SENSITIVITY): Troponin I (High Sensitivity): 4 ng/L (ref <18)

## 2021-03-08 MED ORDER — SODIUM CHLORIDE 0.9 % IV SOLN: 8.0000 mg | INTRAVENOUS | Status: DC

## 2021-03-08 MED ORDER — ONDANSETRON 4 MG PO TBDP: 4.0000 mg | ORAL_TABLET | Freq: Four times a day (QID) | ORAL | 0 refills | Status: AC | PRN

## 2021-03-08 MED ORDER — SODIUM CHLORIDE 0.9 % IV BOLUS
1000.0000 mL | Freq: Once | INTRAVENOUS | Status: AC
  Administered 2021-03-08: 1000 mL via INTRAVENOUS

## 2021-03-08 MED ORDER — ONDANSETRON HCL 4 MG/2ML IJ SOLN
4.0000 mg | INTRAMUSCULAR | Status: AC
  Administered 2021-03-08: 4 mg via INTRAVENOUS
  Filled 2021-03-08: qty 2

## 2021-03-08 MED ORDER — ONDANSETRON HCL 4 MG/2ML IJ SOLN
4.0000 mg | Freq: Four times a day (QID) | INTRAMUSCULAR | Status: DC | PRN
Start: 2021-03-08 — End: 2021-03-08

## 2021-03-08 MED ORDER — SODIUM CHLORIDE 0.9 % IV BOLUS
500.0000 mL | Freq: Once | INTRAVENOUS | Status: AC
  Administered 2021-03-08: 500 mL via INTRAVENOUS

## 2021-03-08 MED ORDER — LOPERAMIDE HCL 2 MG PO CAPS
2.0000 mg | ORAL_CAPSULE | Freq: Once | ORAL | Status: AC
  Administered 2021-03-08: 2 mg via ORAL
  Filled 2021-03-08: qty 1
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
## 2021-03-08 NOTE — ED Provider Notes (Addendum)
[facility] Emergency Department Provider Note   ____________________________________________   Event Date/Time   First MD Initiated Contact with Patient 03/08/21 1319     (approximate)  I have reviewed the triage vital signs and the nursing notes.   HISTORY  Chief Complaint Nausea and Emesis    HPI Wendy Frazier is a 50 y.o. female history of diabetes hyperlipidemia asthma  Patient and multiple family members and came with symptoms of COVID-19 and have tested positive on home testing.  Patient reports that her grandchildren, family, and herself all developed symptoms Thursday and Friday initially her symptoms being runny nose cough and achiness.  She does not have any shortness of breath.  Her cough is resolved but she is for the last 2 days had significant loose watery stools.  No fevers or chills no abdominal pain.  She reports nausea and loose stools.  Patient reports that she called her doctor's office hoping to get a prescription for nausea medicine but they recommended she come to the ER.  She does feel dry and dehydrated.  No abdominal pain.  No ongoing fevers chills or body aches.  Denies weakness except she just feels generally tired.   Typically uses a wheelchair, and continues to do so without difficulty having family bring her to the ER today  Patient reports she would like to get hydrated her nausea improved and be able to go home  Past Medical History:  Diagnosis Date   Asthma    Diabetes mellitus without complication ([place])    History of venomous spider bite 2015   Hospital inpatient tx for 1 week.   Hyperlipidemia    Migraines     Patient Active Problem List   Diagnosis Date Noted   Asthma 05/20/2018   Severe recurrent major depression without psychotic features ([place]) 05/20/2018   Suicidal ideation 05/20/2018   Diabetes ([place]) 03/30/2015   Anemia 03/23/2015   Chest pain 07/28/2014    Past Surgical History:  Procedure  Laterality Date   ABDOMINAL HYSTERECTOMY      Prior to Admission medications   Medication Sig Start Date End Date Taking? Authorizing Provider  ondansetron (ZOFRAN ODT) 4 MG disintegrating tablet Take 1 tablet (4 mg total) by mouth every 6 (six) hours as needed for nausea or vomiting. 03/08/21  Yes Delman Kitten, MD  doxycycline (VIBRA-TABS) 100 MG tablet Take 1 tablet (100 mg total) by mouth 2 (two) times daily. 01/10/20   Lavonia Drafts, MD  fluconazole (DIFLUCAN) 150 MG tablet Take 1 tablet (150 mg total) by mouth daily. 01/10/20   Lavonia Drafts, MD  glimepiride (AMARYL) 2 MG tablet Take 1 tablet (2 mg total) by mouth daily with breakfast. 05/23/18   McNew, Tyson Babinski, MD  guaiFENesin-codeine 100-10 MG/5ML syrup Take 5 mLs by mouth every 6 (six) hours as needed for cough. 01/17/20   Harvest Dark, MD  metFORMIN (GLUCOPHAGE XR) 500 MG 24 hr tablet Take 2 tablets (1,000 mg total) by mouth daily with breakfast. 05/23/18 05/23/19  McNew, Tyson Babinski, MD  naproxen (NAPROSYN) 500 MG tablet Take 1 tablet (500 mg total) by mouth 2 (two) times daily with a meal. 10/08/20   Lavonia Drafts, MD  nitrofurantoin (MACRODANTIN) 100 MG capsule Take 1 capsule (100 mg total) by mouth every six (6) hours. 01/03/21     nitrofurantoin, macrocrystal-monohydrate, (MACROBID) 100 MG capsule Take 1 capsule (100 mg total) by mouth every twelve (12) hours for 7 days. 01/05/21     predniSONE (DELTASONE)  20 MG tablet Take 2 tablets (40 mg total) by mouth daily. 01/17/20   Harvest Dark, MD  sertraline (ZOLOFT) 50 MG tablet Take 1 tablet (50 mg total) by mouth daily. 05/23/18   McNew, Tyson Babinski, MD    Allergies Morphine and related  Family History  Problem Relation Age of Onset   Cancer Mother        Breast   Diabetes Father    Aneurysm Father        Brain    Social History Social History   Tobacco Use   Smoking status: Never   Smokeless tobacco: Never  Substance Use Topics   Alcohol use: No   Drug use: No     Review of Systems Constitutional: No fever/chills except around last Thursday and Friday Eyes: No visual changes. ENT: No sore throat.  Did have a runny nose for about 2 days Cardiovascular: Denies chest pain. Respiratory: Denies shortness of breath.  Slight cough that is now gone away.  No wheezing or shortness of breath Gastrointestinal: No abdominal pain.  See HPI Genitourinary: Negative for dysuria. Musculoskeletal: Negative for back pain.  Some muscle aches earlier in the illness but have gone away Skin: Negative for rash. Neurological: Negative for headaches, areas of focal weakness or numbness.    ____________________________________________   PHYSICAL EXAM:  VITAL SIGNS: ED Triage Vitals  Enc Vitals Group     BP 03/08/21 1119 (!) 122/91     Pulse Rate 03/08/21 1119 99     Resp 03/08/21 1117 18     Temp 03/08/21 1117 98.9 F (37.2 C)     Temp src --      SpO2 03/08/21 1119 94 %     Weight 03/08/21 1118 200 lb (90.7 kg)     Height 03/08/21 1118 '5\' 4"'$  (1.626 m)     Head Circumference --      Peak Flow --      Pain Score 03/08/21 1117 8     Pain Loc --      Pain Edu? --      Excl. in [place]? --     Constitutional: Alert and oriented. Well appearing and in no acute distress. Eyes: Conjunctivae are normal. Head: Atraumatic. Nose: No congestion/rhinnorhea. Mouth/Throat: Mucous membranes are moderately dry. Neck: No stridor.  Cardiovascular: Normal rate, regular rhythm. Grossly normal heart sounds.  Good peripheral circulation. Respiratory: Normal respiratory effort.  No retractions. Lungs CTAB. Gastrointestinal: Soft and nontender. No distention. Musculoskeletal: No lower extremity tenderness nor edema.  Moderate weakness in the lower extremities bilateral patient reports to be chronic and uses wheelchair at baseline reports secondary to diabetes. Neurologic:  Normal speech and language. No gross focal neurologic deficits are appreciated.  Skin:  Skin is warm, dry  and intact. No rash noted. Psychiatric: Mood and affect are normal. Speech and behavior are normal.  Normal hemodynamics  Vitals:   03/08/21 1324 03/08/21 1400  BP: 127/80 114/87  Pulse: 83 81  Resp: 17 18  Temp:    SpO2: 94% 99%    ____________________________________________   LABS (all labs ordered are listed, but only abnormal results are displayed)  Labs Reviewed  BASIC METABOLIC PANEL - Abnormal; Notable for the following components:      Result Value   Glucose, Bld 138 (*)    Creatinine, Ser 1.13 (*)    Calcium 8.8 (*)    GFR, Estimated 59 (*)    All other components within normal limits  CBC  HEPATIC  FUNCTION PANEL  LIPASE, BLOOD  TROPONIN I (HIGH SENSITIVITY)   ____________________________________________  EKG  Reviewed inter by me at 1122 Heart rate 95 QRS 89 QTc 450 Normal sinus rhythm, no evidence of acute ischemia  ____________________________________________  RADIOLOGY  DG Chest 2 View  Result Date: 03/08/2021 CLINICAL DATA:  Chest nausea vomiting EXAM: CHEST - 2 VIEW COMPARISON:  01/17/2020 FINDINGS: The heart size and mediastinal contours are within normal limits. Both lungs are clear. The visualized skeletal structures are unremarkable. IMPRESSION: No active cardiopulmonary disease. Electronically Signed   By: Merilyn Baba M.D.   On: 03/08/2021 12:38    Sex reviewed negative for acute.  Patient denies any pulmonary symptoms.  Clear lungs.  Normal oxygen saturation. ____________________________________________   PROCEDURES  Procedure(s) performed: None  Procedures  Critical Care performed: No  ____________________________________________   INITIAL IMPRESSION / ASSESSMENT AND PLAN / ED COURSE  Pertinent labs & imaging results that were available during my care of the patient were reviewed by me and considered in my medical decision making (see chart for details).   Patient presents for evaluation, she reports being diagnosed positive  with COVID as well as multiple family members.  Symptoms occurring for her since Friday.  She is awake alert oriented hemodynamically stable, but does appear dehydrated by exam.  Labs indicate mild bump in her creatinine.  Will hydrate generously provide antiemetic.  Nontoxic reassuring exam.  Does appear somewhat dehydrated, but no evidence of acute or impending emergency.  Lab work reassuring mildly elevated creatinine from baseline but still just slightly above normal range.  Will hydrate generously  No symptoms of pulmonary COVID noted at this time.  No hypoxia.  No increased work of breathing.  Very reassuring abdominal exam without tenderness any quadrant.  No rebound or guarding.  No pain over Burney's point.  Negative Murphy.  LFTs normal  Clinical Course as of 03/08/21 2109  Wed Mar 08, 2021  1630 Patient is resting comfortably reports her nausea improved.  She feels better not in any pain or discomfort.  Completed IV fluids.  Would like to trial something to eat and drink at this point.  Will provide p.o. challenge at this time [MQ]    Clinical Course User Index [MQ] Delman Kitten, MD    ----------------------------------------- 5:24 PM on 03/08/2021 ----------------------------------------- Patient sitting up, eating crackers, has drank water feels improved nausea resolved.  Fully alert and oriented.  Patient comfortable with careful return precautions and follow-up with PCP.  Discussed careful return precautions around COVID-19 as well as around dehydration and abdominal pain return precautions  Return precautions and treatment recommendations and follow-up discussed with the patient who is agreeable with the plan.  ____________________________________________   FINAL CLINICAL IMPRESSION(S) / ED DIAGNOSES  Final diagnoses:  COVID-19  Viral enteritis        Note:  This document was prepared using Dragon voice recognition software and may include unintentional dictation  errors       Delman Kitten, MD 03/08/21 1725    Delman Kitten, MD 03/08/21 2110

## 2021-05-23 ENCOUNTER — Encounter: Payer: Self-pay | Admitting: Radiology

## 2021-05-23 ENCOUNTER — Emergency Department
Admission: EM | Admit: 2021-05-23 | Discharge: 2021-05-23 | Disposition: A | Discharge status: Home / Self Care | Payer: Medicaid Other | Attending: Emergency Medicine | Admitting: Emergency Medicine

## 2021-05-23 ENCOUNTER — Other Ambulatory Visit: Payer: Self-pay

## 2021-05-23 ENCOUNTER — Emergency Department: Payer: Medicaid Other

## 2021-05-23 DIAGNOSIS — Z7722 Contact with and (suspected) exposure to environmental tobacco smoke (acute) (chronic): Secondary | ICD-10-CM | POA: Diagnosis not present

## 2021-05-23 DIAGNOSIS — Z8616 Personal history of COVID-19: Secondary | ICD-10-CM | POA: Insufficient documentation

## 2021-05-23 DIAGNOSIS — R0602 Shortness of breath: Secondary | ICD-10-CM | POA: Diagnosis not present

## 2021-05-23 DIAGNOSIS — R053 Chronic cough: Secondary | ICD-10-CM | POA: Diagnosis not present

## 2021-05-23 DIAGNOSIS — Z7984 Long term (current) use of oral hypoglycemic drugs: Secondary | ICD-10-CM | POA: Insufficient documentation

## 2021-05-23 DIAGNOSIS — Z20822 Contact with and (suspected) exposure to covid-19: Secondary | ICD-10-CM | POA: Insufficient documentation

## 2021-05-23 DIAGNOSIS — J45909 Unspecified asthma, uncomplicated: Secondary | ICD-10-CM | POA: Insufficient documentation

## 2021-05-23 DIAGNOSIS — E1165 Type 2 diabetes mellitus with hyperglycemia: Secondary | ICD-10-CM | POA: Insufficient documentation

## 2021-05-23 DIAGNOSIS — R059 Cough, unspecified: Secondary | ICD-10-CM | POA: Diagnosis present

## 2021-05-23 LAB — RESP PANEL BY RT-PCR (FLU A&B, COVID) ARPGX2
Influenza A by PCR: NEGATIVE
Influenza B by PCR: NEGATIVE
SARS Coronavirus 2 by RT PCR: NEGATIVE

## 2021-05-23 LAB — CBC WITH DIFFERENTIAL/PLATELET
Abs Immature Granulocytes: 0.03 10*3/uL (ref 0.00–0.07)
Basophils Absolute: 0 10*3/uL (ref 0.0–0.1)
Basophils Relative: 0 %
Eosinophils Absolute: 0.2 10*3/uL (ref 0.0–0.5)
Eosinophils Relative: 2 %
HCT: 35.2 % — ABNORMAL LOW (ref 36.0–46.0)
Hemoglobin: 11.7 g/dL — ABNORMAL LOW (ref 12.0–15.0)
Immature Granulocytes: 0 %
Lymphocytes Relative: 37 %
Lymphs Abs: 3.1 10*3/uL (ref 0.7–4.0)
MCH: 27.3 pg (ref 26.0–34.0)
MCHC: 33.2 g/dL (ref 30.0–36.0)
MCV: 82.1 fL (ref 80.0–100.0)
Monocytes Absolute: 0.5 10*3/uL (ref 0.1–1.0)
Monocytes Relative: 6 %
Neutro Abs: 4.6 10*3/uL (ref 1.7–7.7)
Neutrophils Relative %: 55 %
Platelets: 336 10*3/uL (ref 150–400)
RBC: 4.29 MIL/uL (ref 3.87–5.11)
RDW: 13.3 % (ref 11.5–15.5)
WBC: 8.4 10*3/uL (ref 4.0–10.5)
nRBC: 0 % (ref 0.0–0.2)

## 2021-05-23 LAB — URINALYSIS, ROUTINE W REFLEX MICROSCOPIC
Bilirubin Urine: NEGATIVE
Glucose, UA: NEGATIVE mg/dL
Hgb urine dipstick: NEGATIVE
Ketones, ur: NEGATIVE mg/dL
Leukocytes,Ua: NEGATIVE
Nitrite: NEGATIVE
Protein, ur: 30 mg/dL — AB
Specific Gravity, Urine: 1.026 (ref 1.005–1.030)
pH: 5 (ref 5.0–8.0)

## 2021-05-23 LAB — COMPREHENSIVE METABOLIC PANEL
ALT: 12 U/L (ref 0–44)
AST: 19 U/L (ref 15–41)
Albumin: 3.2 g/dL — ABNORMAL LOW (ref 3.5–5.0)
Alkaline Phosphatase: 89 U/L (ref 38–126)
Anion gap: 9 (ref 5–15)
BUN: 26 mg/dL — ABNORMAL HIGH (ref 6–20)
CO2: 24 mmol/L (ref 22–32)
Calcium: 9 mg/dL (ref 8.9–10.3)
Chloride: 106 mmol/L (ref 98–111)
Creatinine, Ser: 1.08 mg/dL — ABNORMAL HIGH (ref 0.44–1.00)
GFR, Estimated: >60 mL/min (ref >60)
Glucose, Bld: 225 mg/dL — ABNORMAL HIGH (ref 70–99)
Potassium: 3.9 mmol/L (ref 3.5–5.1)
Sodium: 139 mmol/L (ref 135–145)
Total Bilirubin: 0.5 mg/dL (ref 0.3–1.2)
Total Protein: 7.5 g/dL (ref 6.5–8.1)

## 2021-05-23 LAB — TROPONIN I (HIGH SENSITIVITY): Troponin I (High Sensitivity): 7 ng/L (ref <18)

## 2021-05-23 LAB — BRAIN NATRIURETIC PEPTIDE: B Natriuretic Peptide: 19.3 pg/mL (ref 0.0–100.0)

## 2021-05-23 MED ORDER — AZITHROMYCIN 250 MG PO TABS: ORAL_TABLET | ORAL | 0 refills | Status: AC

## 2021-05-23 MED ORDER — AZITHROMYCIN 250 MG PO TABS
ORAL_TABLET | ORAL | 0 refills | Status: DC
  Filled 2021-05-23: qty 6, 5d supply, fill #0

## 2021-05-23 MED ORDER — IOHEXOL 350 MG/ML SOLN
75.0000 mL | Freq: Once | INTRAVENOUS | Status: AC | PRN
  Administered 2021-05-23: 75 mL via INTRAVENOUS
  Filled 2021-05-23: qty 75

## 2021-05-23 NOTE — ED Notes (Signed)
ED Provider at bedside. 

## 2021-05-23 NOTE — Discharge Instructions (Addendum)
Take the antibiotics to see if this helps with any inflammation but otherwise you need to call the pulmonary doctor to schedule an appointment.  You have seen him previously and he was planning on doing some additional studies for your chronic cough.  Return to the ER if develop fevers, shortness of breath or any other concern    IMPRESSION: No pulmonary emboli or other acute chest pathology. The lungs are clear.

## 2021-05-23 NOTE — ED Provider Notes (Signed)
Emergency Medicine Provider Triage Evaluation Note  Wendy Frazier , a 50 y.o. female  was evaluated in triage.  Pt complains of cough x 3 weeks. Evaluated by PCP twice. No better with meds prescribed No fever.  Review of Systems  Positive: Cough, diarrhea Negative: Fever  Physical Exam  LMP 11/10/2017 (Approximate)  Gen:   Awake, no distress   Resp:  Normal effort  MSK:   Moves extremities without difficulty  Other:    Medical Decision Making  Medically screening exam initiated at 12:17 PM.  Appropriate orders placed.  Miliana Gangwer Sherley was informed that the remainder of the evaluation will be completed by another provider, this initial triage assessment does not replace that evaluation, and the importance of remaining in the ED until their evaluation is complete.   Victorino Dike, FNP 05/23/21 1222    Lavonia Drafts, MD 05/23/21 1251

## 2021-05-23 NOTE — ED Triage Notes (Signed)
Pt reports bad cough for 2 weeks ago, states had covid 21mos ago. States that she has seen her pcp twice and still can't rid of her cough

## 2021-05-23 NOTE — ED Provider Notes (Signed)
[facility] Emergency Department Provider Note  ____________________________________________   Event Date/Time   First MD Initiated Contact with Patient 05/23/21 1303     (approximate)  I have reviewed the triage vital signs and the nursing notes.   HISTORY  Chief Complaint Cough    HPI Wendy Frazier is a 50 y.o. female with diabetes, hyperlipidemia, asthma, demyelinating disorder currently wheelchair-bound who comes in with concern for continued cough.  Patient reports having COVID on 8/17.  Patient denies that she really had any cough from this.  She states this cough started separately and was about 3 weeks ago, constant, not better with medication given by primary doctor, nothing makes it worse.  She does report some shortness of breath associated with it.  Does report having a history of blood clots and previously on Eliquis but taken off recently.  She states that her doctor prescribed her Tessalon Perles, inhaler, Zyrtec.  She reports no improvement with this.  She denies any personal history of smoking but has significant amount of people being around her that smoke.  On review of records she does report having some coughing episodes previously and has been seen by pulmonary who thought it could be secondary to aspiration          Past Medical History:  Diagnosis Date   Asthma    Diabetes mellitus without complication ([place])    History of venomous spider bite 2015   Hospital inpatient tx for 1 week.   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 Authorizing Provider  doxycycline (VIBRA-TABS) 100 MG tablet Take 1 tablet (100 mg total) by mouth 2 (two) times daily. 01/10/20   Lavonia Drafts, MD  fluconazole (DIFLUCAN) 150 MG tablet Take 1 tablet (150 mg total) by mouth daily. 01/10/20   Lavonia Drafts, MD  glimepiride (AMARYL) 2 MG tablet Take 1 tablet (2 mg total) by mouth daily with breakfast. 05/23/18   McNew, Tyson Babinski, MD  guaiFENesin-codeine 100-10 MG/5ML syrup Take 5 mLs by mouth every 6 (six) hours as needed for cough. 01/17/20   Harvest Dark, MD  metFORMIN (GLUCOPHAGE XR) 500 MG 24 hr tablet Take 2 tablets (1,000 mg total) by mouth daily with breakfast. 05/23/18 05/23/19  McNew, Tyson Babinski, MD  naproxen (NAPROSYN) 500 MG tablet Take 1 tablet (500 mg total) by mouth 2 (two) times daily with a meal. 10/08/20   Lavonia Drafts, MD  nitrofurantoin (MACRODANTIN) 100 MG capsule Take 1 capsule (100 mg total) by mouth every six (6) hours. 01/03/21     nitrofurantoin, macrocrystal-monohydrate, (MACROBID) 100 MG capsule Take 1 capsule (100 mg total) by mouth every twelve (12) hours for 7 days. 01/05/21     ondansetron (ZOFRAN ODT) 4 MG disintegrating tablet Take 1 tablet (4 mg total) by mouth every 6 (six) hours as needed for nausea or vomiting. 03/08/21   Delman Kitten, MD  predniSONE (DELTASONE) 20 MG tablet Take 2 tablets (40 mg total) by mouth daily. 01/17/20   Harvest Dark, MD  sertraline (ZOLOFT) 50 MG tablet  Take 1 tablet (50 mg total) by mouth daily. 05/23/18   McNew, Tyson Babinski, MD    Allergies Morphine and related  Family History  Problem Relation Age of Onset   Cancer Mother        Breast   Diabetes Father    Aneurysm Father        Brain    Social History Social History   Tobacco Use   Smoking status: Never   Smokeless tobacco: Never  Substance Use Topics   Alcohol use: No   Drug use: No      Review of Systems Constitutional: No fever/chills Eyes: No visual changes. ENT: No sore throat. Cardiovascular: Denies  chest pain. Respiratory: Positive cough, shortness of breath Gastrointestinal: No abdominal pain.  No nausea, no vomiting.  No diarrhea.  No constipation. Genitourinary: Negative for dysuria. Musculoskeletal: Negative for back pain. Skin: Negative for rash. Neurological: Negative for headaches, focal weakness or numbness. All other ROS negative ____________________________________________   PHYSICAL EXAM:  VITAL SIGNS: ED Triage Vitals  Enc Vitals Group     BP 05/23/21 1218 113/87     Pulse Rate 05/23/21 1218 90     Resp 05/23/21 1218 18     Temp 05/23/21 1218 98.7 F (37.1 C)     Temp src --      SpO2 05/23/21 1218 97 %     Weight 05/23/21 1219 200 lb (90.7 kg)     Height 05/23/21 1219 5\' 4"  (1.626 m)     Head Circumference --      Peak Flow --      Pain Score 05/23/21 1219 0     Pain Loc --      Pain Edu? --      Excl. in [place]? --     Constitutional: Alert and oriented. Well appearing and in no acute distress.  Patient is in a wheelchair Eyes: Conjunctivae are normal. EOMI. Head: Atraumatic. Nose: No congestion/rhinnorhea. Mouth/Throat: Mucous membranes are moist.   Neck: No stridor. Trachea Midline. FROM Cardiovascular: Normal rate, regular rhythm. Grossly normal heart sounds.  Good peripheral circulation. Respiratory: Normal respiratory effort.  No retractions. Lungs CTAB. Gastrointestinal: Soft and nontender. No distention. No abdominal bruits.  Musculoskeletal: No lower extremity tenderness nor edema.  No joint effusions.  Patient is wheelchair-bound at baseline Neurologic:  Normal speech and language. No gross focal neurologic deficits are appreciated.  Skin:  Skin is warm, dry and intact. No rash noted. Psychiatric: Mood and affect are normal. Speech and behavior are normal. GU: Deferred   ____________________________________________   LABS (all labs ordered are listed, but only abnormal results are displayed)  Labs Reviewed  COMPREHENSIVE METABOLIC PANEL -  Abnormal; Notable for the following components:      Result Value   Glucose, Bld 225 (*)    BUN 26 (*)    Creatinine, Ser 1.08 (*)    Albumin 3.2 (*)    All other components within normal limits  CBC WITH DIFFERENTIAL/PLATELET - Abnormal; Notable for the following components:   Hemoglobin 11.7 (*)    HCT 35.2 (*)    All other components within normal limits  RESP PANEL BY RT-PCR (FLU A&B, COVID) ARPGX2  URINALYSIS, ROUTINE W REFLEX MICROSCOPIC   ____________________________________________   ED ECG REPORT I, Vanessa Tilden, the attending physician, personally viewed and interpreted this ECG.  Normal sinus rhythm 93, no ST elevation, no T wave inversions, normal intervals ____________________________________________  RADIOLOGY Robert Bellow, personally viewed and  evaluated these images (plain radiographs) as part of my medical decision making, as well as reviewing the written report by the radiologist.  ED MD interpretation:  no pna   Official radiology report(s): DG Chest 2 View  Result Date: 05/23/2021 CLINICAL DATA:  Cough for 3 weeks. EXAM: CHEST - 2 VIEW COMPARISON:  03/08/2021 FINDINGS: The heart size and mediastinal contours are within normal limits. Lungs are clear. No pleural effusion or edema. No airspace densities identified. Visualized osseous structures are unremarkable. IMPRESSION: No active cardiopulmonary abnormalities. Electronically Signed   By: Kerby Moors M.D.   On: 05/23/2021 12:48   CT Angio Chest PE W and/or Wo Contrast  Result Date: 05/23/2021 CLINICAL DATA:  Pulmonary emboli suspected. High probability. Cough over the last 3 weeks. Recent coronavirus infection. EXAM: CT ANGIOGRAPHY CHEST WITH CONTRAST TECHNIQUE: Multidetector CT imaging of the chest was performed using the standard protocol during bolus administration of intravenous contrast. Multiplanar CT image reconstructions and MIPs were obtained to evaluate the vascular anatomy. CONTRAST:  19mL  OMNIPAQUE IOHEXOL 350 MG/ML SOLN COMPARISON:  Chest radiography same day.  CT angiography 01/17/2020. FINDINGS: Cardiovascular: Heart size is normal. No pericardial effusion. No definite coronary artery calcification is visible. No aortic atherosclerotic calcification. No aortic aneurysm or dissection. Pulmonary arterial opacification is good. There are no pulmonary emboli. Mediastinum/Nodes: No mediastinal or hilar mass or lymphadenopathy. Lungs/Pleura: No pleural effusion. No pulmonary infiltrate or collapse. No mass or nodule. Upper Abdomen: Normal except for a stable 11 mm left adrenal adenoma. Musculoskeletal: Minimal thoracic degenerative changes. Review of the MIP images confirms the above findings. IMPRESSION: No pulmonary emboli or other acute chest pathology. The lungs are clear. Electronically Signed   By: Nelson Chimes M.D.   On: 05/23/2021 15:13    ____________________________________________   PROCEDURES  Procedure(s) performed (including Critical Care):  Procedures   ____________________________________________   INITIAL IMPRESSION / ASSESSMENT AND PLAN / ED COURSE  Jewels Langone Clink was evaluated in Emergency Department on 05/23/2021 for the symptoms described in the history of present illness. She was evaluated in the context of the global COVID-19 pandemic, which necessitated consideration that the patient might be at risk for infection with the SARS-CoV-2 virus that causes COVID-19. Institutional protocols and algorithms that pertain to the evaluation of patients at risk for COVID-19 are in a state of rapid change based on information released by regulatory bodies including the CDC and federal and state organizations. These policies and algorithms were followed during the patient's care in the ED.    Patient comes in with a continued cough and shortness of breath.  Given her history of PE as well as history of cancer will get CT PE to rule out pulmonary embolism as well as to look for  any type of lung cancer given not getting better with symptomatic treatments.  Chest x-ray was initially ordered from triage that has no evidence of pneumonia.  COVID, flu, test was also taken out front.  Labs ordered evaluate for any electrolyte abnormalities, AKI.  She denies any falls need a CT of her head denies any abdominal pain to need a CT of her abdomen    Labs are reassuring with stable hemoglobin, no white count elevation is no severe infection, kidney function is around baseline.  Sugar is slightly elevated but no evidence of DKA.  Chest x-ray negative for any pneumonia  CT scan negative.  Discussed with patient we will give a course of azithromycin to help with any kind of inflammation  of her lungs.  I do not need hear any wheezing to suggest needing an inhaler.  Patient has elevated sugar so do not like steroids is indicated.  She will call the pulmonary doctor to make a follow-up appointment.  Patient expressed understanding felt comfortable with discharge  I discussed the provisional nature of ED diagnosis, the treatment so far, the ongoing plan of care, follow up appointments and return precautions with the patient and any family or support people present. They expressed understanding and agreed with the plan, discharged home.            ____________________________________________   FINAL CLINICAL IMPRESSION(S) / ED DIAGNOSES   Final diagnoses:  Chronic cough      MEDICATIONS GIVEN DURING THIS VISIT:  Medications  iohexol (OMNIPAQUE) 350 MG/ML injection 75 mL (75 mLs Intravenous Contrast Given 05/23/21 1459)     ED Discharge Orders          Ordered    azithromycin (ZITHROMAX Z-PAK) 250 MG tablet        05/23/21 1529             Note:  This document was prepared using Dragon voice recognition software and may include unintentional dictation errors.    Vanessa Ivanhoe, MD 05/23/21 [phone]

## 2021-07-01 ENCOUNTER — Emergency Department
Admission: EM | Admit: 2021-07-01 | Discharge: 2021-07-02 | Disposition: A | Discharge status: Home / Self Care | Payer: No Typology Code available for payment source | Attending: Emergency Medicine | Admitting: Emergency Medicine

## 2021-07-01 ENCOUNTER — Other Ambulatory Visit: Payer: Self-pay

## 2021-07-01 DIAGNOSIS — F332 Major depressive disorder, recurrent severe without psychotic features: Secondary | ICD-10-CM | POA: Insufficient documentation

## 2021-07-01 DIAGNOSIS — R45851 Suicidal ideations: Secondary | ICD-10-CM | POA: Insufficient documentation

## 2021-07-01 DIAGNOSIS — Z7984 Long term (current) use of oral hypoglycemic drugs: Secondary | ICD-10-CM | POA: Insufficient documentation

## 2021-07-01 DIAGNOSIS — F430 Acute stress reaction: Secondary | ICD-10-CM | POA: Diagnosis not present

## 2021-07-01 DIAGNOSIS — F4325 Adjustment disorder with mixed disturbance of emotions and conduct: Secondary | ICD-10-CM | POA: Insufficient documentation

## 2021-07-01 DIAGNOSIS — R451 Restlessness and agitation: Secondary | ICD-10-CM | POA: Insufficient documentation

## 2021-07-01 DIAGNOSIS — Y900 Blood alcohol level of less than 20 mg/100 ml: Secondary | ICD-10-CM | POA: Insufficient documentation

## 2021-07-01 DIAGNOSIS — Z20822 Contact with and (suspected) exposure to covid-19: Secondary | ICD-10-CM | POA: Insufficient documentation

## 2021-07-01 DIAGNOSIS — J45909 Unspecified asthma, uncomplicated: Secondary | ICD-10-CM | POA: Insufficient documentation

## 2021-07-01 DIAGNOSIS — Z046 Encounter for general psychiatric examination, requested by authority: Secondary | ICD-10-CM | POA: Insufficient documentation

## 2021-07-01 DIAGNOSIS — E119 Type 2 diabetes mellitus without complications: Secondary | ICD-10-CM | POA: Insufficient documentation

## 2021-07-01 MED ORDER — ZIPRASIDONE MESYLATE 20 MG IM SOLR
20.0000 mg | Freq: Once | INTRAMUSCULAR | Status: AC
  Administered 2021-07-01: 20 mg via INTRAMUSCULAR

## 2021-07-01 NOTE — ED Triage Notes (Addendum)
Pt states she does not walk due to diabetes. Pt states she is feeling suicidal. Pt tearful and hostile to staff. Pt states she feels like her sister would be better off is she were dead.

## 2021-07-01 NOTE — ED Notes (Signed)
Belongings placed in one labeled bag to take to [facility]hu locker room.

## 2021-07-01 NOTE — ED Notes (Addendum)
Pt screaming and spitting at staff in restroom when attempting to dress pt out and assess how she gets ito and out of wheelchair and off commode at home.  Pt cussing at staff when staff giving simple directions. Pt swinging arms, not following directions. Pt states "get me the fuck out of here, I didn't want to come here, treat me like shit". Multiple attempts to explain dress out procedure and attempt to secure urine sample from pt, but pt yelling at staff about getting out of wheelchair with help.pt informed there is not a need to speak to staff in such manner, but pt continues to yell and spit at staff.

## 2021-07-01 NOTE — ED Provider Notes (Addendum)
[facility] Emergency Department Provider Note  ____________________________________________   Event Date/Time   First MD Initiated Contact with Patient 07/01/21 2332     (approximate)  I have reviewed the triage vital signs and the nursing notes.   HISTORY  Chief Complaint Suicidal    HPI Wendy Frazier is a 50 y.o. female with history of CIDP, insulin-dependent diabetes, hyperlipidemia, asthma, migraine headaches, severe depression who presents to the emergency department with EMS with suicidal thoughts.  Patient reports that she called the suicide hotline asking for help and they called police who called EMS to bring her to the emergency department.  Patient states that she feels like she would be better off dead and wants to die.  She does not report any plan for her suicidal thoughts.  She is very agitated and verbally and physically aggressive and difficult to redirect.  Patient screaming at staff and spitting on staff members.  She denies drug or alcohol use.        Past Medical History:  Diagnosis Date   Asthma    Diabetes mellitus without complication ([place])    History of venomous spider bite 2015   Hospital inpatient tx for 1 week.   Hyperlipidemia    Migraines     Patient Active Problem List   Diagnosis Date Noted   Asthma 05/20/2018   Severe recurrent major depression without psychotic features ([place]) 05/20/2018   Suicidal ideation 05/20/2018   Diabetes ([place]) 03/30/2015   Anemia 03/23/2015   Chest pain 07/28/2014    Past Surgical History:  Procedure Laterality Date   ABDOMINAL HYSTERECTOMY      Prior to Admission medications   Medication Sig Start Date End Date Taking? Authorizing Provider  doxycycline (VIBRA-TABS) 100 MG tablet Take 1 tablet (100 mg total) by mouth 2 (two) times daily. 01/10/20   Lavonia Drafts, MD  fluconazole (DIFLUCAN) 150 MG tablet Take 1 tablet (150 mg total) by mouth daily. 01/10/20   Lavonia Drafts, MD   glimepiride (AMARYL) 2 MG tablet Take 1 tablet (2 mg total) by mouth daily with breakfast. 05/23/18   McNew, Tyson Babinski, MD  guaiFENesin-codeine 100-10 MG/5ML syrup Take 5 mLs by mouth every 6 (six) hours as needed for cough. 01/17/20   Harvest Dark, MD  metFORMIN (GLUCOPHAGE XR) 500 MG 24 hr tablet Take 2 tablets (1,000 mg total) by mouth daily with breakfast. 05/23/18 05/23/19  McNew, Tyson Babinski, MD  naproxen (NAPROSYN) 500 MG tablet Take 1 tablet (500 mg total) by mouth 2 (two) times daily with a meal. 10/08/20   Lavonia Drafts, MD  nitrofurantoin (MACRODANTIN) 100 MG capsule Take 1 capsule (100 mg total) by mouth every six (6) hours. 01/03/21     nitrofurantoin, macrocrystal-monohydrate, (MACROBID) 100 MG capsule Take 1 capsule (100 mg total) by mouth every twelve (12) hours for 7 days. 01/05/21     ondansetron (ZOFRAN ODT) 4 MG disintegrating tablet Take 1 tablet (4 mg total) by mouth every 6 (six) hours as needed for nausea or vomiting. 03/08/21   Delman Kitten, MD  predniSONE (DELTASONE) 20 MG tablet Take 2 tablets (40 mg total) by mouth daily. 01/17/20   Harvest Dark, MD  sertraline (ZOLOFT) 50 MG tablet Take 1 tablet (50 mg total) by mouth daily. 05/23/18   McNew, Tyson Babinski, MD    Allergies Morphine and related  Family History  Problem Relation Age of Onset   Cancer Mother        Breast   Diabetes  Father    Aneurysm Father        Brain    Social History Social History   Tobacco Use   Smoking status: Never   Smokeless tobacco: Never  Substance Use Topics   Alcohol use: No   Drug use: No    Review of Systems Level 5 caveat due to patient's agitation  ____________________________________________   PHYSICAL EXAM:  VITAL SIGNS: ED Triage Vitals  Enc Vitals Group     BP 07/01/21 2331 (!) 136/97     Pulse Rate 07/01/21 2331 (!) 110     Resp 07/01/21 2331 (!) 120     Temp 07/01/21 2331 99.1 F (37.3 C)     Temp Source 07/01/21 2331 Oral     SpO2 07/01/21 2331 97 %      Weight 07/01/21 2333 200 lb (90.7 kg)     Height 07/01/21 2333 5\' 4"  (1.626 m)     Head Circumference --      Peak Flow --      Pain Score --      Pain Loc --      Pain Edu? --      Excl. in [place]? --    CONSTITUTIONAL: Alert and oriented and responds appropriately to questions.  Appears older than stated age, chronically ill-appearing HEAD: Normocephalic EYES: Conjunctivae clear, pupils appear equal, EOM appear intact ENT: normal nose; moist mucous membranes NECK: Supple, normal ROM CARD: RRR; S1 and S2 appreciated; no murmurs, no clicks, no rubs, no gallops RESP: Normal chest excursion without splinting or tachypnea; breath sounds clear and equal bilaterally; no wheezes, no rhonchi, no rales, no hypoxia or respiratory distress, speaking full sentences ABD/GI: Normal bowel sounds; non-distended; soft, non-tender, no rebound, no guarding, no peritoneal signs, no hepatosplenomegaly BACK: The back appears normal EXT: Normal ROM in all joints; no deformity noted, no edema; no cyanosis SKIN: Normal color for age and race; warm; no rash on exposed skin NEURO: Moves all extremities equally, slightly slurred speech, no facial asymmetry PSYCH: Patient extremely agitated, raising her voice and spitting on staff members.  Difficult to redirect.  Endorses suicidal thoughts.  Denies HI or hallucinations.  Poor eye contact.  Does not appear to be responding to internal stimuli.  ____________________________________________   LABS (all labs ordered are listed, but only abnormal results are displayed)  Labs Reviewed  COMPREHENSIVE METABOLIC PANEL - Abnormal; Notable for the following components:      Result Value   Glucose, Bld 366 (*)    Creatinine, Ser 1.12 (*)    GFR, Estimated 60 (*)    All other components within normal limits  SALICYLATE LEVEL - Abnormal; Notable for the following components:   Salicylate Lvl <5.4 (*)    All other components within normal limits  ACETAMINOPHEN LEVEL -  Abnormal; Notable for the following components:   Acetaminophen (Tylenol), Serum <10 (*)    All other components within normal limits  CBC - Abnormal; Notable for the following components:   WBC 13.5 (*)    All other components within normal limits  RESP PANEL BY RT-PCR (FLU A&B, COVID) ARPGX2  ETHANOL  URINE DRUG SCREEN, QUALITATIVE ([facility] ONLY)  CBG MONITORING, ED  CBG MONITORING, ED  CBG MONITORING, ED  CBG MONITORING, ED   ____________________________________________  EKG   ____________________________________________  RADIOLOGY I, Patriciaann Rabanal, personally viewed and evaluated these images (plain radiographs) as part of my medical decision making, as well as reviewing the written report by the radiologist.  ED MD interpretation:    Official radiology report(s): No results found.  ____________________________________________   PROCEDURES  Procedure(s) performed (including Critical Care):  Procedures  CRITICAL CARE Performed by: Pryor Curia   Total critical care time: 40 minutes  Critical care time was exclusive of separately billable procedures and treating other patients.  Critical care was necessary to treat or prevent imminent or life-threatening deterioration.  Critical care was time spent personally by me on the following activities: development of treatment plan with patient and/or surrogate as well as nursing, discussions with consultants, evaluation of patient's response to treatment, examination of patient, obtaining history from patient or surrogate, ordering and performing treatments and interventions, ordering and review of laboratory studies, ordering and review of radiographic studies, pulse oximetry and re-evaluation of patient's condition.  ____________________________________________   INITIAL IMPRESSION / ASSESSMENT AND PLAN / ED COURSE  As part of my medical decision making, I reviewed the following data within the electronic medical  record:  Nursing notes reviewed and incorporated, Labs reviewed , Old chart reviewed, A consult was requested and obtained from this/these consultant(s) Psychiatry, and Notes from prior ED visits         Patient here with suicidal thoughts.  Extremely agitated and physically aggressive towards staff.  Patient placed under full IVC and given IM sedation which patient agrees to take due to her agitation.  Will obtain screening labs, urine.  Will discuss with psychiatry for further disposition.  I have ordered her home medications based on most recent notes in care everywhere.  ED PROGRESS  Patient is hyperglycemic but not in DKA.  Otherwise labs, urine unremarkable.  Medically cleared.  Will be evaluated by psychiatry in the morning for further disposition.  Patient resting comfortably after Geodon.  Agitation has resolved.  Patient's home medications were reordered based on notes in care everywhere.  Patient was not cooperative with answering many questions and I am unable to confirm these medications with her.  I reviewed all nursing notes and pertinent previous records as available.  I have reviewed and interpreted any EKGs, lab and urine results, imaging (as available).  ____________________________________________   FINAL CLINICAL IMPRESSION(S) / ED DIAGNOSES  Final diagnoses:  Suicidal ideation  Agitation     ED Discharge Orders     None       *Please note:  Wendy Frazier was evaluated in Emergency Department on 07/02/2021 for the symptoms described in the history of present illness. She was evaluated in the context of the global COVID-19 pandemic, which necessitated consideration that the patient might be at risk for infection with the SARS-CoV-2 virus that causes COVID-19. Institutional protocols and algorithms that pertain to the evaluation of patients at risk for COVID-19 are in a state of rapid change based on information released by regulatory bodies including the CDC and  federal and state organizations. These policies and algorithms were followed during the patient's care in the ED.  Some ED evaluations and interventions may be delayed as a result of limited staffing during and the pandemic.*   Note:  This document was prepared using Dragon voice recognition software and may include unintentional dictation errors.    Aarionna Germer, Delice Bison, DO 07/02/21 0259    Bela Bonaparte, Delice Bison, DO 07/02/21 3546

## 2021-07-02 DIAGNOSIS — F430 Acute stress reaction: Secondary | ICD-10-CM

## 2021-07-02 LAB — URINE DRUG SCREEN, QUALITATIVE (ARMC ONLY)
Amphetamines, Ur Screen: NOT DETECTED
Barbiturates, Ur Screen: NOT DETECTED
Benzodiazepine, Ur Scrn: NOT DETECTED
Cannabinoid 50 Ng, Ur ~~LOC~~: NOT DETECTED
Cocaine Metabolite,Ur ~~LOC~~: NOT DETECTED
MDMA (Ecstasy)Ur Screen: NOT DETECTED
Methadone Scn, Ur: NOT DETECTED
Opiate, Ur Screen: NOT DETECTED
Phencyclidine (PCP) Ur S: NOT DETECTED
Tricyclic, Ur Screen: NOT DETECTED

## 2021-07-02 LAB — COMPREHENSIVE METABOLIC PANEL
ALT: 17 U/L (ref 0–44)
AST: 18 U/L (ref 15–41)
Albumin: 3.6 g/dL (ref 3.5–5.0)
Alkaline Phosphatase: 113 U/L (ref 38–126)
Anion gap: 12 (ref 5–15)
BUN: 15 mg/dL (ref 6–20)
CO2: 25 mmol/L (ref 22–32)
Calcium: 9.2 mg/dL (ref 8.9–10.3)
Chloride: 98 mmol/L (ref 98–111)
Creatinine, Ser: 1.12 mg/dL — ABNORMAL HIGH (ref 0.44–1.00)
GFR, Estimated: 60 mL/min — ABNORMAL LOW (ref >60)
Glucose, Bld: 366 mg/dL — ABNORMAL HIGH (ref 70–99)
Potassium: 3.5 mmol/L (ref 3.5–5.1)
Sodium: 135 mmol/L (ref 135–145)
Total Bilirubin: 0.6 mg/dL (ref 0.3–1.2)
Total Protein: 8 g/dL (ref 6.5–8.1)

## 2021-07-02 LAB — CBC
HCT: 39.9 % (ref 36.0–46.0)
Hemoglobin: 13.1 g/dL (ref 12.0–15.0)
MCH: 26.9 pg (ref 26.0–34.0)
MCHC: 32.8 g/dL (ref 30.0–36.0)
MCV: 81.9 fL (ref 80.0–100.0)
Platelets: 354 10*3/uL (ref 150–400)
RBC: 4.87 MIL/uL (ref 3.87–5.11)
RDW: 13.8 % (ref 11.5–15.5)
WBC: 13.5 10*3/uL — ABNORMAL HIGH (ref 4.0–10.5)
nRBC: 0 % (ref 0.0–0.2)

## 2021-07-02 LAB — CBG MONITORING, ED
Glucose-Capillary: 317 mg/dL — ABNORMAL HIGH (ref 70–99)
Glucose-Capillary: 310 mg/dL — ABNORMAL HIGH (ref 70–99)

## 2021-07-02 LAB — ACETAMINOPHEN LEVEL: Acetaminophen (Tylenol), Serum: <10 ug/mL — ABNORMAL LOW (ref 10–30)

## 2021-07-02 LAB — ETHANOL: Alcohol, Ethyl (B): <10 mg/dL (ref <10)

## 2021-07-02 LAB — SALICYLATE LEVEL: Salicylate Lvl: <7 mg/dL — ABNORMAL LOW (ref 7.0–30.0)

## 2021-07-02 LAB — RESP PANEL BY RT-PCR (FLU A&B, COVID) ARPGX2
Influenza A by PCR: NEGATIVE
Influenza B by PCR: NEGATIVE
SARS Coronavirus 2 by RT PCR: NEGATIVE

## 2021-07-02 MED ORDER — LISINOPRIL 5 MG PO TABS: 5.0000 mg | ORAL_TABLET | Freq: Once | ORAL | Status: DC

## 2021-07-02 MED ORDER — IPRATROPIUM-ALBUTEROL 0.5-2.5 (3) MG/3ML IN SOLN: 3.0000 mL | Freq: Four times a day (QID) | RESPIRATORY_TRACT | Status: DC | PRN

## 2021-07-02 MED ORDER — DULOXETINE HCL 30 MG PO CPEP
90.0000 mg | ORAL_CAPSULE | Freq: Every day | ORAL | Status: DC
Start: 2021-07-02 — End: 2021-07-02
  Administered 2021-07-02: 90 mg via ORAL
  Filled 2021-07-02: qty 1

## 2021-07-02 MED ORDER — INSULIN ASPART 100 UNIT/ML IJ SOLN: 0.0000 [IU] | Freq: Three times a day (TID) | INTRAMUSCULAR | Status: DC

## 2021-07-02 MED ORDER — ATORVASTATIN CALCIUM 20 MG PO TABS
10.0000 mg | ORAL_TABLET | Freq: Every day | ORAL | Status: DC
Start: 2021-07-02 — End: 2021-07-02
  Administered 2021-07-02: 10 mg via ORAL
  Filled 2021-07-02: qty 1

## 2021-07-02 MED ORDER — INSULIN ASPART 100 UNIT/ML IJ SOLN
5.0000 [IU] | Freq: Three times a day (TID) | INTRAMUSCULAR | Status: DC
Start: 2021-07-02 — End: 2021-07-02
  Administered 2021-07-02 (×2): 5 [IU] via SUBCUTANEOUS
  Filled 2021-07-02 (×2): qty 1

## 2021-07-02 MED ORDER — PREDNISONE 10 MG PO TABS
10.0000 mg | ORAL_TABLET | Freq: Every day | ORAL | Status: DC
  Administered 2021-07-02: 10 mg via ORAL
  Filled 2021-07-02: qty 1

## 2021-07-02 MED ORDER — LISINOPRIL 5 MG PO TABS
5.0000 mg | ORAL_TABLET | Freq: Once | ORAL | Status: AC
  Administered 2021-07-02: 5 mg via ORAL
  Filled 2021-07-02: qty 1

## 2021-07-02 MED ORDER — GABAPENTIN 300 MG PO CAPS
900.0000 mg | ORAL_CAPSULE | Freq: Four times a day (QID) | ORAL | Status: DC
Start: 2021-07-02 — End: 2021-07-02
  Administered 2021-07-02 (×2): 900 mg via ORAL
  Filled 2021-07-02 (×2): qty 3

## 2021-07-02 MED ORDER — TOPIRAMATE 25 MG PO TABS
25.0000 mg | ORAL_TABLET | Freq: Two times a day (BID) | ORAL | Status: DC
Start: 2021-07-02 — End: 2021-07-02
  Administered 2021-07-02: 25 mg via ORAL
  Filled 2021-07-02: qty 1

## 2021-07-02 MED ORDER — INSULIN GLARGINE-YFGN 100 UNIT/ML ~~LOC~~ SOLN
23.0000 [IU] | Freq: Every day | SUBCUTANEOUS | Status: DC
  Administered 2021-07-02: 23 [IU] via SUBCUTANEOUS
  Filled 2021-07-02 (×2): qty 0.23

## 2021-07-02 MED ORDER — ALBUTEROL SULFATE (2.5 MG/3ML) 0.083% IN NEBU: 2.5000 mg | INHALATION_SOLUTION | RESPIRATORY_TRACT | Status: DC | PRN

## 2021-07-02 NOTE — Discharge Instructions (Addendum)
Follow up with outpatient provider and peer support

## 2021-07-02 NOTE — ED Notes (Signed)
Pt. Is currently sleeping, breakfast is at nursing station.

## 2021-07-02 NOTE — ED Notes (Signed)
IVC/pending psych consult 

## 2021-07-02 NOTE — BHH Counselor (Addendum)
TTS obtained collateral information from pt's sister Melina Modena: [phone]) who reports she does not have any concern for pt's safety in the community. Melissa feels safe with pt returning to their home where they live together. Pt's sister denied pt having any past suicidal attempts. Caller reports "she has been doing this for the past 35 years ... Yesterday she became upset after I screamed back at her". There are weapons in the home, as pt's brother-in-law owns hunting rifles. However, pt's sister confirmed with assurance that these guns are stored in a secure locked safe in sister's bedroom and pt's wheelchair prevents pt from accessing her sister's bedroom.  Pt's sister further reports that pt is currently linked to [facility] where pt receives Peer Support Services Production designer, theatre/television/film) and Outpatient Therapy Services Legrand Como). [facility] are also in the process of transferring pt to a higher level of care (ie. Enhanced outpatient service).  Pt's sister said she will pick-up pt at the time of discharge, if pt is agreeable to this plan.

## 2021-07-02 NOTE — ED Notes (Signed)
Pt covered with warm blanket. Pt refuses to answer questions at this time.

## 2021-07-02 NOTE — ED Notes (Signed)
Report to angela, rn. 

## 2021-07-02 NOTE — ED Notes (Signed)
Psych team at bedside speaking with pt

## 2021-07-02 NOTE — ED Notes (Signed)
Pt transferred self from bed to wheelchair. Pt wheeled to restroom where she transferred herself from chair to toilet. Pt instructed to press call light when she is ready to go back to her bed.

## 2021-07-02 NOTE — ED Notes (Signed)
Spoke with pts sister, Lenna Sciara, informed her that pt is ready for discharge. Melissa will come pick patient up.

## 2021-07-02 NOTE — ED Notes (Signed)
Spoke with dr. Leonides Schanz regarding blood glucose level. No new orders for sliding scale insulin received. Md states will order pt's home medicaitons.

## 2021-07-02 NOTE — ED Provider Notes (Signed)
-----------------------------------------   3:08 PM on 07/02/2021 -----------------------------------------  Per psychiatry NP Reita Cliche, the patient has been psychiatrically cleared.  She is stable for discharge.  The IVC is being rescinded.  Return precautions provided.   Arta Silence, MD 07/02/21 [phone]

## 2021-07-02 NOTE — ED Notes (Signed)
Pt. Is awake, assisted pt to the bathroom, with wheelchair and the help of RN Levada Dy. Pt. Also got breakfast tray with juice.
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
## 2021-07-02 NOTE — ED Notes (Signed)
Pt verbalized to staff that when she came in last night she was very upset and that she was rude to staff. Pt states that she did not mean to be ugly to staff. Pt has been calm and pleasant with staff this morning.

## 2021-07-02 NOTE — ED Notes (Signed)
Pt given meal tray and diet sprite to drink
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
## 2021-08-15 ENCOUNTER — Telehealth: Payer: Self-pay | Admitting: Pharmacy Technician

## 2021-08-15 NOTE — Telephone Encounter (Signed)
Patient failed to provide 2023 proof of income.  No additional medication assistance will be provided by [facility] without the required proof of income documentation.  Patient notified by letter.  [place], [place]  [id number]  August 14, 2021    [facility] [address] [place], [place]  [address]  Dear Beverlee Nims:  This is to inform you that you are no longer eligible to receive medication assistance at [facility].  The reason(s) are:    _____Your total gross monthly household income exceeds 300% of the Federal Poverty Level.   _____Tangible assets (savings, checking, stocks/bonds, pension, retirement, etc.) exceeds our limit  _____You are eligible to receive benefits from [facility], [facility] or HIV Medication              Assistance Program _____You are eligible to receive benefits from a Medicare Part D plan _____You have prescription insurance  _____You are not an [facility] resident __X__Failure to provide all requested documentation (proof of income for 2023, and/or Patient Intake Application, DOH Attestation, Contract, etc).    Medication assistance will resume once all requested documentation has been returned to our clinic.  If you have questions, please contact our clinic at [phone].    Thank you,  [facility]

## 2021-09-05 ENCOUNTER — Other Ambulatory Visit: Payer: Self-pay

## 2021-09-19 ENCOUNTER — Ambulatory Visit: Payer: Medicare Other | Attending: Neurology

## 2021-09-19 ENCOUNTER — Ambulatory Visit: Payer: Self-pay

## 2021-09-19 DIAGNOSIS — G4733 Obstructive sleep apnea (adult) (pediatric): Secondary | ICD-10-CM | POA: Insufficient documentation

## 2021-09-20 ENCOUNTER — Other Ambulatory Visit: Payer: Self-pay

## 2021-11-09 ENCOUNTER — Other Ambulatory Visit: Payer: Self-pay | Admitting: Nurse Practitioner

## 2021-11-09 DIAGNOSIS — R1310 Dysphagia, unspecified: Secondary | ICD-10-CM

## 2021-11-13 ENCOUNTER — Ambulatory Visit
Admission: RE | Admit: 2021-11-13 | Discharge: 2021-11-13 | Disposition: A | Discharge status: Home / Self Care | Payer: Medicare Other | Source: Ambulatory Visit | Attending: Nurse Practitioner | Admitting: Nurse Practitioner

## 2021-11-13 DIAGNOSIS — R1310 Dysphagia, unspecified: Secondary | ICD-10-CM | POA: Diagnosis not present

## 2021-11-16 ENCOUNTER — Other Ambulatory Visit: Payer: Medicare Other

## 2021-12-14 ENCOUNTER — Encounter: Payer: Self-pay | Admitting: Gastroenterology

## 2021-12-15 ENCOUNTER — Ambulatory Visit: Payer: Medicare Other | Admitting: Anesthesiology

## 2021-12-15 ENCOUNTER — Encounter
Admission: RE | Disposition: A | Discharge status: Home / Self Care | Payer: Self-pay | Source: Home / Self Care | Attending: Gastroenterology

## 2021-12-15 ENCOUNTER — Encounter: Payer: Self-pay | Admitting: Gastroenterology

## 2021-12-15 ENCOUNTER — Ambulatory Visit
Admission: RE | Admit: 2021-12-15 | Discharge: 2021-12-15 | Disposition: A | Discharge status: Home / Self Care | Payer: Medicare Other | Attending: Gastroenterology | Admitting: Gastroenterology

## 2021-12-15 DIAGNOSIS — Z993 Dependence on wheelchair: Secondary | ICD-10-CM | POA: Diagnosis not present

## 2021-12-15 DIAGNOSIS — R053 Chronic cough: Secondary | ICD-10-CM | POA: Insufficient documentation

## 2021-12-15 DIAGNOSIS — K317 Polyp of stomach and duodenum: Secondary | ICD-10-CM | POA: Diagnosis not present

## 2021-12-15 DIAGNOSIS — K5730 Diverticulosis of large intestine without perforation or abscess without bleeding: Secondary | ICD-10-CM | POA: Diagnosis not present

## 2021-12-15 DIAGNOSIS — Z8543 Personal history of malignant neoplasm of ovary: Secondary | ICD-10-CM | POA: Insufficient documentation

## 2021-12-15 DIAGNOSIS — R519 Headache, unspecified: Secondary | ICD-10-CM | POA: Insufficient documentation

## 2021-12-15 DIAGNOSIS — K2960 Other gastritis without bleeding: Secondary | ICD-10-CM | POA: Insufficient documentation

## 2021-12-15 DIAGNOSIS — R1310 Dysphagia, unspecified: Secondary | ICD-10-CM | POA: Insufficient documentation

## 2021-12-15 DIAGNOSIS — K640 First degree hemorrhoids: Secondary | ICD-10-CM | POA: Diagnosis not present

## 2021-12-15 DIAGNOSIS — Z8711 Personal history of peptic ulcer disease: Secondary | ICD-10-CM | POA: Diagnosis not present

## 2021-12-15 DIAGNOSIS — R531 Weakness: Secondary | ICD-10-CM | POA: Diagnosis not present

## 2021-12-15 DIAGNOSIS — Z1211 Encounter for screening for malignant neoplasm of colon: Secondary | ICD-10-CM | POA: Diagnosis present

## 2021-12-15 DIAGNOSIS — E119 Type 2 diabetes mellitus without complications: Secondary | ICD-10-CM | POA: Insufficient documentation

## 2021-12-15 DIAGNOSIS — K449 Diaphragmatic hernia without obstruction or gangrene: Secondary | ICD-10-CM | POA: Insufficient documentation

## 2021-12-15 DIAGNOSIS — G379 Demyelinating disease of central nervous system, unspecified: Secondary | ICD-10-CM | POA: Diagnosis not present

## 2021-12-15 DIAGNOSIS — K224 Dyskinesia of esophagus: Secondary | ICD-10-CM | POA: Diagnosis not present

## 2021-12-15 HISTORY — DX: Malignant (primary) neoplasm, unspecified: C80.1

## 2021-12-15 HISTORY — PX: COLONOSCOPY: SHX5424

## 2021-12-15 HISTORY — DX: Dependence on wheelchair: Z99.3

## 2021-12-15 HISTORY — PX: ESOPHAGOGASTRODUODENOSCOPY: SHX5428

## 2021-12-15 LAB — GLUCOSE, CAPILLARY
Glucose-Capillary: 408 mg/dL — ABNORMAL HIGH (ref 70–99)
Glucose-Capillary: 403 mg/dL — ABNORMAL HIGH (ref 70–99)
Glucose-Capillary: 280 mg/dL — ABNORMAL HIGH (ref 70–99)

## 2021-12-15 SURGERY — COLONOSCOPY
Anesthesia: General

## 2021-12-15 MED ORDER — SODIUM CHLORIDE 0.9 % IV SOLN: INTRAVENOUS | Status: DC

## 2021-12-15 MED ORDER — PHENYLEPHRINE 80 MCG/ML (10ML) SYRINGE FOR IV PUSH (FOR BLOOD PRESSURE SUPPORT)
PREFILLED_SYRINGE | INTRAVENOUS | Status: DC | PRN
  Administered 2021-12-15 (×4): 80 ug via INTRAVENOUS

## 2021-12-15 MED ORDER — INSULIN ASPART 100 UNIT/ML IJ SOLN
INTRAMUSCULAR | Status: AC
  Filled 2021-12-15: qty 1

## 2021-12-15 MED ORDER — INSULIN ASPART 100 UNIT/ML IJ SOLN
8.0000 [IU] | Freq: Once | INTRAMUSCULAR | Status: AC
  Administered 2021-12-15: 8 [IU] via INTRAVENOUS

## 2021-12-15 MED ORDER — PROPOFOL 500 MG/50ML IV EMUL
INTRAVENOUS | Status: DC | PRN
Start: 2021-12-15 — End: 2021-12-15
  Administered 2021-12-15: 150 ug/kg/min via INTRAVENOUS

## 2021-12-15 MED ORDER — PROPOFOL 10 MG/ML IV BOLUS
INTRAVENOUS | Status: DC | PRN
  Administered 2021-12-15: 70 mg via INTRAVENOUS

## 2021-12-15 MED ORDER — PROPOFOL 500 MG/50ML IV EMUL
INTRAVENOUS | Status: AC
  Filled 2021-12-15: qty 50

## 2021-12-15 NOTE — Anesthesia Preprocedure Evaluation (Addendum)
Anesthesia Evaluation  Patient identified by MRN, date of birth, ID band Patient awake    Reviewed: Allergy & Precautions, NPO status , Patient's Chart, lab work & pertinent test results  History of Anesthesia Complications Negative for: history of anesthetic complications  Airway Mallampati: I   Neck ROM: Full    Dental  (+) Missing   Pulmonary asthma ,    Pulmonary exam normal breath sounds clear to auscultation       Cardiovascular Normal cardiovascular exam Rhythm:Regular Rate:Normal  ECG 05/23/21:  Normal sinus rhythm Cannot rule out Anterior infarct , age undetermined  Echo 03/25/19:  1. Normal left ventricular size and systolic function, ejection fraction > 55%.  2. Normal right ventricular size and systolic function.  3. No significant valvular abnormalities.    Neuro/Psych  Headaches,    GI/Hepatic negative GI ROS,   Endo/Other  diabetes, Poorly Controlled, Type 2  Renal/GU negative Renal ROS     Musculoskeletal   Abdominal   Peds  Hematology negative hematology ROS (+)   Anesthesia Other Findings   Reproductive/Obstetrics                            Anesthesia Physical Anesthesia Plan  ASA: 3  Anesthesia Plan: General   Post-op Pain Management:    Induction: Intravenous  PONV Risk Score and Plan: 3 and Propofol infusion, TIVA and Treatment may vary due to age or medical condition  Airway Management Planned: Natural Airway  Additional Equipment:   Intra-op Plan:   Post-operative Plan:   Informed Consent: I have reviewed the patients History and Physical, chart, labs and discussed the procedure including the risks, benefits and alternatives for the proposed anesthesia with the patient or authorized representative who has indicated his/her understanding and acceptance.       Plan Discussed with: CRNA  Anesthesia Plan Comments: (LMA/GETA backup discussed.   Patient consented for risks of anesthesia including but not limited to:  - adverse reactions to medications - damage to eyes, teeth, lips or other oral mucosa - nerve damage due to positioning  - sore throat or hoarseness - damage to heart, brain, nerves, lungs, other parts of body or loss of life  Informed patient about role of CRNA in peri- and intra-operative care.  Patient voiced understanding.)       Anesthesia Quick Evaluation

## 2021-12-15 NOTE — Op Note (Signed)
[facility] Patient Name: Wendy Frazier Procedure Date: 12/15/2021 9:17 AM MRN: [id number] Account #: [id number] Date of Birth: July 11, 1971 Admit Type: Outpatient Age: 51 Room: [facility] ENDO ROOM 1 Gender: Female Note Status: Finalized Instrument Name: Colonscope [id number] Procedure:             Colonoscopy Indications:           Screening for colorectal malignant neoplasm Providers:             Rueben Bash, DO Referring MD:          Marylen Ponto Medicines:             Monitored Anesthesia Care Complications:         No immediate complications. Estimated blood loss: None. Procedure:             Pre-Anesthesia Assessment:                        - Prior to the procedure, a History and Physical was                         performed, and patient medications and allergies were                         reviewed. The patient is competent. The risks and                         benefits of the procedure and the sedation options and                         risks were discussed with the patient. All questions                         were answered and informed consent was obtained.                         Patient identification and proposed procedure were                         verified by the physician, the nurse, the anesthetist                         and the technician in the endoscopy suite. Mental                         Status Examination: alert and oriented. Airway                         Examination: normal oropharyngeal airway and neck                         mobility. Respiratory Examination: clear to                         auscultation. CV Examination: RRR, no murmurs, no S3                         or S4. Prophylactic Antibiotics: The patient does not  require prophylactic antibiotics. Prior                         Anticoagulants: The patient has taken no previous                         anticoagulant or  antiplatelet agents. ASA Grade                         Assessment: III - A patient with severe systemic                         disease. After reviewing the risks and benefits, the                         patient was deemed in satisfactory condition to                         undergo the procedure. The anesthesia plan was to use                         monitored anesthesia care (MAC). Immediately prior to                         administration of medications, the patient was                         re-assessed for adequacy to receive sedatives. The                         heart rate, respiratory rate, oxygen saturations,                         blood pressure, adequacy of pulmonary ventilation, and                         response to care were monitored throughout the                         procedure. The physical status of the patient was                         re-assessed after the procedure.                        After obtaining informed consent, the colonoscope was                         passed under direct vision. Throughout the procedure,                         the patient's blood pressure, pulse, and oxygen                         saturations were monitored continuously. The                         Colonoscope was introduced through the anus and  advanced to the the terminal ileum, with                         identification of the appendiceal orifice and IC                         valve. The colonoscopy was performed without                         difficulty. The patient tolerated the procedure well.                         The quality of the bowel preparation was evaluated                         using the BBPS [facility] Bowel Preparation Scale) with                         scores of: Right Colon = 3, Transverse Colon = 3 and                         Left Colon = 3 (entire mucosa seen well with no                         residual staining, small fragments  of stool or opaque                         liquid). The total BBPS score equals 9. The terminal                         ileum, ileocecal valve, appendiceal orifice, and                         rectum were photographed. Findings:      The perianal and digital rectal examinations were normal. Pertinent       negatives include normal sphincter tone.      The terminal ileum appeared normal. Estimated blood loss: none.      Multiple small-mouthed diverticula were found in the entire colon. More       prevalent in Left colon Estimated blood loss: none.      Non-bleeding internal hemorrhoids were found during retroflexion. The       hemorrhoids were Grade I (internal hemorrhoids that do not prolapse).      The exam was otherwise without abnormality on direct and retroflexion       views. Impression:            - The examined portion of the ileum was normal.                        - Diverticulosis in the entire examined colon.                        - Non-bleeding internal hemorrhoids.                        - The examination was otherwise normal on direct and  retroflexion views.                        - No specimens collected. Recommendation:        - Discharge patient to home.                        - Resume previous diet.                        - Continue present medications.                        - Repeat colonoscopy in 10 years for screening                         purposes unless family history of colon cancer or                         polyps then 5 years.                        - Return to GI office as previously scheduled.                        - The findings and recommendations were discussed with                         the patient. Procedure Code(s):     --- Professional ---                        [phone], Colonoscopy, flexible; diagnostic, including                         collection of specimen(s) by brushing or washing, when                          performed (separate procedure) Diagnosis Code(s):     --- Professional ---                        Z12.11, Encounter for screening for malignant neoplasm                         of colon                        K64.0, First degree hemorrhoids                        K57.30, Diverticulosis of large intestine without                         perforation or abscess without bleeding CPT copyright 2019 American Medical Association. All rights reserved. The codes documented in this report are preliminary and upon coder review may  be revised to meet current compliance requirements. Attending Participation:      I personally performed the entire procedure. Volney American, DO [name] DO, DO 12/15/2021 10:37:28 AM This report has been signed electronically. Number of Addenda: 0 Note Initiated On: 12/15/2021 9:17 AM Scope Withdrawal Time: 0 hours 18 minutes 35  seconds  Total Procedure Duration: 0 hours 22 minutes 1 second  Estimated Blood Loss:  Estimated blood loss: none.      [facility]

## 2021-12-15 NOTE — Transfer of Care (Signed)
Immediate Anesthesia Transfer of Care Note  Patient: Wendy Frazier  Procedure(s) Performed: COLONOSCOPY ESOPHAGOGASTRODUODENOSCOPY (EGD)  Patient Location: Endoscopy Unit  Anesthesia Type:General  Level of Consciousness: drowsy  Airway & Oxygen Therapy: Patient Spontanous Breathing and Patient connected to face mask oxygen  Post-op Assessment: Report given to RN and Post -op Vital signs reviewed and stable  Post vital signs: Reviewed and stable  Last Vitals:  Vitals Value Taken Time  BP 117/76 12/15/21 1038  Temp 35.8 C 12/15/21 1037  Pulse 67 12/15/21 1039  Resp 18 12/15/21 1039  SpO2 100 % 12/15/21 1039    Last Pain:  Vitals:   12/15/21 1037  TempSrc: Temporal  PainSc:          Complications: No notable events documented.

## 2021-12-15 NOTE — H&P (Signed)
Pre-Procedure H&P   Patient ID: Wendy Frazier is a 51 y.o. female.  Gastroenterology Provider: [name], DO  Referring Provider: Dawson Bills, NP PCP: [facility] [place], [place]  Date: 12/15/2021  HPI [name] is a 51 y.o. female who presents today for Esophagogastroduodenoscopy and Colonoscopy for chronic cough, dysphagia, screening colonoscopy. 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 Has difficulty with masticating and swallowing food. MBSS in 2021 with oropharyngeal dysphagia. April 2023 barium swallow study w/o stricture and only demonstrating tertiary contractions. Experiencing recurrent aspiration and chronic cough with regurgitation.  Blood glucose is 408 today.    Ovarian cancer in 2022.  Mother- cirrhosis, hcv. Deceased  Cr 1.1 hgb 13.1 mcv 82 plt 354  No h/o egd or colonoscopy    Past Medical History:  Diagnosis Date   Asthma    Cancer ([place])    Diabetes mellitus without complication ([place])    History of venomous spider bite 2015   Hospital inpatient tx for 1 week.   Hyperlipidemia    Migraines    Wheelchair dependence     Past Surgical History:  Procedure Laterality Date   ABDOMINAL HYSTERECTOMY      Family History Mother- cirrhosis- hcv; deceased No h/o GI disease or malignancy  Review of Systems  Constitutional:  Negative for activity change, appetite change, chills, diaphoresis, fatigue, fever and unexpected weight change.  HENT:  Positive for trouble swallowing. Negative for voice change.   Respiratory:  Negative for shortness of breath and wheezing.   Cardiovascular:  Negative for chest pain, palpitations and leg swelling.  Gastrointestinal:  Negative for abdominal distention, abdominal pain, anal bleeding, blood in stool, constipation, diarrhea, nausea, rectal pain and vomiting.  Musculoskeletal:  Negative for arthralgias and myalgias.  Skin:  Negative for color change and pallor.  Neurological:  Positive for  weakness (chronic). Negative for dizziness and syncope.  Psychiatric/Behavioral:  Negative for agitation and confusion.   All other systems reviewed and are negative.   Medications No current facility-administered medications on file prior to encounter.   Current Outpatient Medications on File Prior to Encounter  Medication Sig Dispense Refill   aspirin EC 81 MG tablet Take 81 mg by mouth daily. Swallow whole.     atorvastatin (LIPITOR) 10 MG tablet Take 10 mg by mouth daily.     cetirizine (ZYRTEC) 10 MG tablet Take 10 mg by mouth daily as needed.     DULoxetine (CYMBALTA) 30 MG capsule Take 90 mg by mouth daily.     gabapentin (NEURONTIN) 300 MG capsule Take 3 capsules by mouth in the morning, at noon, in the evening, and at bedtime.     glimepiride (AMARYL) 2 MG tablet Take 1 tablet (2 mg total) by mouth daily with breakfast. 30 tablet 0   lisinopril (ZESTRIL) 5 MG tablet Take 5 mg by mouth daily.     losartan (COZAAR) 25 MG tablet Take 25 mg by mouth daily.     naproxen (NAPROSYN) 500 MG tablet Take 1 tablet (500 mg total) by mouth 2 (two) times daily with a meal. 20 tablet 2   NOVOLOG FLEXPEN 100 UNIT/ML FlexPen Inject 5 Units into the skin in the morning, at noon, and at bedtime.     guaiFENesin-codeine 100-10 MG/5ML syrup Take 5 mLs by mouth every 6 (six) hours as needed for cough. 120 mL 0   LANTUS SOLOSTAR 100 UNIT/ML Solostar Pen Inject 23 Units into the skin at bedtime.  metFORMIN (GLUCOPHAGE XR) 500 MG 24 hr tablet Take 2 tablets (1,000 mg total) by mouth daily with breakfast. 60 tablet 0   ondansetron (ZOFRAN ODT) 4 MG disintegrating tablet Take 1 tablet (4 mg total) by mouth every 6 (six) hours as needed for nausea or vomiting. 20 tablet 0   predniSONE (DELTASONE) 20 MG tablet Take 2 tablets (40 mg total) by mouth daily. (Patient not taking: Reported on 07/02/2021) 10 tablet 0   sertraline (ZOLOFT) 50 MG tablet Take 1 tablet (50 mg total) by mouth daily. (Patient not taking:  Reported on 07/02/2021) 30 tablet 1   topiramate (TOPAMAX) 25 MG capsule Take 1 capsule by mouth in the morning and at bedtime.      Pertinent medications related to GI and procedure were reviewed by me with the patient prior to the procedure   Current Facility-Administered Medications:    0.9 %  sodium chloride infusion, , Intravenous, Continuous, [name], DO, Last Rate: 20 mL/hr at 12/15/21 0929, New Bag at 12/15/21 0929   insulin aspart (novoLOG) injection 8 Units, 8 Units, Intravenous, Once, Darrin Nipper, MD      Allergies  Allergen Reactions   Morphine And Related Other (See Comments)    confusion   Allergies were reviewed by me prior to the procedure  Objective   Body mass index is 34.33 kg/m. Vitals:   12/15/21 0907  BP: (!) 113/93  Pulse: 98  Resp: 16  Temp: (!) 96.8 F (36 C)  TempSrc: Temporal  SpO2: 98%  Weight: 90.7 kg  Height: '5\' 4"'$  (1.626 m)     Physical Exam Vitals and nursing note reviewed.  Constitutional:      General: She is not in acute distress.    Appearance: She is obese. She is not ill-appearing, toxic-appearing or diaphoretic.     Comments: Wheel chair bound.  HENT:     Head: Normocephalic and atraumatic.     Nose: Nose normal.     Mouth/Throat:     Mouth: Mucous membranes are moist.     Pharynx: Oropharynx is clear.     Comments: Poor dentition Eyes:     General: No scleral icterus.    Extraocular Movements: Extraocular movements intact.  Cardiovascular:     Rate and Rhythm: Normal rate and regular rhythm.     Heart sounds: Normal heart sounds. No murmur heard.   No friction rub. No gallop.  Pulmonary:     Effort: Pulmonary effort is normal. No respiratory distress.     Breath sounds: Normal breath sounds. No wheezing, rhonchi or rales.  Abdominal:     General: Bowel sounds are normal. There is no distension.     Palpations: Abdomen is soft.     Tenderness: There is no abdominal tenderness. There is no guarding  or rebound.  Musculoskeletal:     Cervical back: Neck supple.     Right lower leg: No edema.     Left lower leg: No edema.  Skin:    General: Skin is warm and dry.     Coloration: Skin is not jaundiced or pale.  Neurological:     Mental Status: She is alert and oriented to person, place, and time. Mental status is at baseline.  Psychiatric:        Mood and Affect: Mood normal.        Behavior: Behavior normal.        Thought Content: Thought content normal.        Judgment:  Judgment normal.     Assessment:  [name] is a 51 y.o. female  who presents today for Esophagogastroduodenoscopy and Colonoscopy for chronic cough, dysphagia, screening colonoscopy.  Plan:  Esophagogastroduodenoscopy and Colonoscopy with possible intervention today  Esophagogastroduodenoscopy and Colonoscopy with possible biopsy, control of bleeding, polypectomy, and interventions as necessary has been discussed with the patient/patient representative. Informed consent was obtained from the patient/patient representative after explaining the indication, nature, and risks of the procedure including but not limited to death, bleeding, perforation, missed neoplasm/lesions, cardiorespiratory compromise, and reaction to medications. Opportunity for questions was given and appropriate answers were provided. Patient/patient representative has verbalized understanding is amenable to undergoing the procedure.   [name], DO  [facility]  Portions of the record may have been created with voice recognition software. Occasional wrong-word or 'sound-a-like' substitutions may have occurred due to the inherent limitations of voice recognition software.  Read the chart carefully and recognize, using context, where substitutions may have occurred.

## 2021-12-15 NOTE — Op Note (Signed)
[facility]astroenterology Patient Name: Wendy Frazier Procedure Date: 12/15/2021 9:53 AM MRN: [id number] Account #: [id number] Date of Birth: [birth year] Admit Type: Outpatient Age: 51 Room: [facility] ENDO ROOM 1 Gender: Female Note Status: Finalized Instrument Name: Upper Endoscope [id number] Procedure:             Upper GI endoscopy Indications:           Dysphagia Providers:             [name] DO, DO Referring MD:          Marylen Ponto Medicines:             Monitored Anesthesia Care Complications:         No immediate complications. Estimated blood loss:                         Minimal. Procedure:             Pre-Anesthesia Assessment:                        - Prior to the procedure, a History and Physical was                         performed, and patient medications and allergies were                         reviewed. The patient is competent. The risks and                         benefits of the procedure and the sedation options and                         risks were discussed with the patient. All questions                         were answered and informed consent was obtained.                         Patient identification and proposed procedure were                         verified by the physician, the nurse, the                         anesthesiologist, the anesthetist and the technician                         in the endoscopy suite. Mental Status Examination:                         alert and oriented. Airway Examination: normal                         oropharyngeal airway and neck mobility. Respiratory                         Examination: clear to auscultation. CV Examination:  RRR, no murmurs, no S3 or S4. Prophylactic                         Antibiotics: The patient does not require prophylactic                         antibiotics. Prior Anticoagulants: The patient has                         taken no  previous anticoagulant or antiplatelet                         agents. ASA Grade Assessment: III - A patient with                         severe systemic disease. After reviewing the risks and                         benefits, the patient was deemed in satisfactory                         condition to undergo the procedure. The anesthesia                         plan was to use monitored anesthesia care (MAC).                         Immediately prior to administration of medications,                         the patient was re-assessed for adequacy to receive                         sedatives. The heart rate, respiratory rate, oxygen                         saturations, blood pressure, adequacy of pulmonary                         ventilation, and response to care were monitored                         throughout the procedure. The physical status of the                         patient was re-assessed after the procedure.                        After obtaining informed consent, the endoscope was                         passed under direct vision. Throughout the procedure,                         the patient's blood pressure, pulse, and oxygen                         saturations were monitored continuously. The Endoscope  was introduced through the mouth, and advanced to the                         second part of duodenum. The upper GI endoscopy was                         accomplished without difficulty. The patient tolerated                         the procedure well. Findings:      The duodenal bulb, first portion of the duodenum and second portion of       the duodenum were normal. Estimated blood loss: none.      A single 2 to 4 mm sessile polyp with no bleeding and no stigmata of       recent bleeding was found in the cardia. erosion noted overlying polyp.       no signs of bleeding. Estimated blood loss was minimal.      Normal mucosa was found in the entire  examined stomach. Biopsies were       taken with a cold forceps for Helicobacter pylori testing. Estimated       blood loss was minimal.      A small hiatal hernia was present. Estimated blood loss: none.      The Z-line was regular.      Esophagogastric landmarks were identified: the gastroesophageal junction       was found at 36 cm from the incisors.      Abnormal motility was noted in the esophagus. The cricopharyngeus was       normal. There is spasticity of the esophageal body. The distal       esophagus/lower esophageal sphincter is open. Biopsies were taken with a       cold forceps for histology. Estimated blood loss was minimal. The scope       was withdrawn. Dilation was performed with a Maloney dilator with no       resistance at 52 Fr. The dilation site was examined following endoscope       reinsertion and showed no change. Estimated blood loss: none.      The exam of the esophagus was otherwise normal. Impression:            - Normal duodenal bulb, first portion of the duodenum                         and second portion of the duodenum.                        - A single gastric polyp.                        - Normal mucosa was found in the entire stomach.                         Biopsied.                        - Small hiatal hernia.                        - Z-line regular.                        -  Esophagogastric landmarks identified.                        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 Biopsied. Dilated. Recommendation:        - Discharge patient to home.                        - Soft diet.                        - Continue present medications.                        - Await pathology results.                        - Return to GI clinic as previously scheduled.                        - The findings and recommendations were discussed with                         the patient. Procedure Code(s):     --- Professional  ---                        [id number], Esophagogastroduodenoscopy, flexible,                         transoral; with biopsy, single or multiple                        43450, Dilation of esophagus, by unguided sound or                         bougie, single or multiple passes Diagnosis Code(s):     --- Professional ---                        K31.7, Polyp of stomach and duodenum                        K44.9, Diaphragmatic hernia without obstruction or                         gangrene                        K22.4, Dyskinesia of esophagus                        R13.10, Dysphagia, unspecified CPT copyright 2019 American Medical Association. All rights reserved. The codes documented in this report are preliminary and upon coder review may  be revised to meet current compliance requirements. Attending Participation:      I personally performed the entire procedure. Volney American, DO [name] DO, DO 12/15/2021 10:08:21 AM This report has been signed electronically. Number of Addenda: 0 Note Initiated On: 12/15/2021 9:53 AM Estimated Blood Loss:  Estimated blood loss was minimal.      [facility]

## 2021-12-15 NOTE — Interval H&P Note (Signed)
History and Physical Interval Note: Preprocedure H&P from 12/15/21  was reviewed and there was no interval change after seeing and examining the patient.  Written consent was obtained from the patient after discussion of risks, benefits, and alternatives. Patient has consented to proceed with Esophagogastroduodenoscopy and Colonoscopy with possible intervention   12/15/2021 9:44 AM  Wendy Frazier  has presented today for surgery, with the diagnosis of Screen for colon cancer (Z12.11) Dysphagia, unspecified type (R13.10).  The various methods of treatment have been discussed with the patient and family. After consideration of risks, benefits and other options for treatment, the patient has consented to  Procedure(s): COLONOSCOPY (N/A) ESOPHAGOGASTRODUODENOSCOPY (EGD) (N/A) as a surgical intervention.  The patient's history has been reviewed, patient examined, no change in status, stable for surgery.  I have reviewed the patient's chart and labs.  Questions were answered to the patient's satisfaction.     [name]

## 2021-12-15 NOTE — Progress Notes (Signed)
Blood sugar 408 Dr. Erenest Rasher notified and 8 units Novolog adm.

## 2021-12-15 NOTE — Anesthesia Postprocedure Evaluation (Signed)
Anesthesia Post Note  Patient: Lynnet Hefley Schwier  Procedure(s) Performed: COLONOSCOPY ESOPHAGOGASTRODUODENOSCOPY (EGD)  Patient location during evaluation: PACU Anesthesia Type: General Level of consciousness: awake and alert, oriented and patient cooperative Pain management: pain level controlled Vital Signs Assessment: post-procedure vital signs reviewed and stable Respiratory status: spontaneous breathing, nonlabored ventilation and respiratory function stable Cardiovascular status: blood pressure returned to baseline and stable Postop Assessment: adequate PO intake Anesthetic complications: no   No notable events documented.   Last Vitals:  Vitals:   12/15/21 1116 12/15/21 1125  BP: 124/77 130/70  Pulse:  76  Resp: 16   Temp:    SpO2: 97% 98%    Last Pain:  Vitals:   12/15/21 1125  TempSrc:   PainSc: 0-No pain                 Darrin Nipper

## 2021-12-19 ENCOUNTER — Encounter: Payer: Self-pay | Admitting: Gastroenterology

## 2021-12-20 LAB — SURGICAL PATHOLOGY

## 2021-12-30 ENCOUNTER — Encounter: Payer: Self-pay | Admitting: Emergency Medicine

## 2021-12-30 ENCOUNTER — Other Ambulatory Visit: Payer: Self-pay

## 2021-12-30 ENCOUNTER — Emergency Department
Admission: EM | Admit: 2021-12-30 | Discharge: 2021-12-30 | Disposition: A | Discharge status: Home / Self Care | Payer: Medicare Other | Attending: Emergency Medicine | Admitting: Emergency Medicine

## 2021-12-30 DIAGNOSIS — L0231 Cutaneous abscess of buttock: Secondary | ICD-10-CM | POA: Diagnosis not present

## 2021-12-30 DIAGNOSIS — L0291 Cutaneous abscess, unspecified: Secondary | ICD-10-CM

## 2021-12-30 LAB — CBC WITH DIFFERENTIAL/PLATELET
Abs Immature Granulocytes: 0.03 10*3/uL (ref 0.00–0.07)
Basophils Absolute: 0 10*3/uL (ref 0.0–0.1)
Basophils Relative: 0 %
Eosinophils Absolute: 0.2 10*3/uL (ref 0.0–0.5)
Eosinophils Relative: 3 %
HCT: 35.2 % — ABNORMAL LOW (ref 36.0–46.0)
Hemoglobin: 11.3 g/dL — ABNORMAL LOW (ref 12.0–15.0)
Immature Granulocytes: 0 %
Lymphocytes Relative: 32 %
Lymphs Abs: 2.8 10*3/uL (ref 0.7–4.0)
MCH: 25.6 pg — ABNORMAL LOW (ref 26.0–34.0)
MCHC: 32.1 g/dL (ref 30.0–36.0)
MCV: 79.6 fL — ABNORMAL LOW (ref 80.0–100.0)
Monocytes Absolute: 0.5 10*3/uL (ref 0.1–1.0)
Monocytes Relative: 5 %
Neutro Abs: 5.1 10*3/uL (ref 1.7–7.7)
Neutrophils Relative %: 60 %
Platelets: 456 10*3/uL — ABNORMAL HIGH (ref 150–400)
RBC: 4.42 MIL/uL (ref 3.87–5.11)
RDW: 12.8 % (ref 11.5–15.5)
WBC: 8.6 10*3/uL (ref 4.0–10.5)
nRBC: 0 % (ref 0.0–0.2)

## 2021-12-30 LAB — BASIC METABOLIC PANEL
Anion gap: 8 (ref 5–15)
BUN: 16 mg/dL (ref 6–20)
CO2: 28 mmol/L (ref 22–32)
Calcium: 9 mg/dL (ref 8.9–10.3)
Chloride: 98 mmol/L (ref 98–111)
Creatinine, Ser: 0.92 mg/dL (ref 0.44–1.00)
GFR, Estimated: >60 mL/min (ref >60)
Glucose, Bld: 373 mg/dL — ABNORMAL HIGH (ref 70–99)
Potassium: 4.3 mmol/L (ref 3.5–5.1)
Sodium: 134 mmol/L — ABNORMAL LOW (ref 135–145)

## 2021-12-30 MED ORDER — CEPHALEXIN 500 MG PO CAPS: 500.0000 mg | ORAL_CAPSULE | Freq: Three times a day (TID) | ORAL | 0 refills | Status: AC

## 2021-12-30 MED ORDER — PIPERACILLIN-TAZOBACTAM 3.375 G IVPB 30 MIN
3.3750 g | Freq: Once | INTRAVENOUS | Status: AC
  Administered 2021-12-30: 3.375 g via INTRAVENOUS
  Filled 2021-12-30: qty 50

## 2021-12-30 NOTE — Discharge Instructions (Signed)
Follow-up with your regular doctor if not improving 3 days.  Return emergency department worsening.  Take medications as prescribed.  Apply warm compress to the area.

## 2021-12-30 NOTE — ED Provider Notes (Signed)
[facility] Provider Note    Event Date/Time   First MD Initiated Contact with Patient 12/30/21 1539     (approximate)   History   Abscess   HPI  Wendy Frazier is a 51 y.o. female presents emergency department with concerns of an abscess on the left buttock.  States the area has been draining.  No fever or chills.  States the area is just sore.      Physical Exam   Triage Vital Signs: ED Triage Vitals  Enc Vitals Group     BP 12/30/21 1149 118/88     Pulse Rate 12/30/21 1149 93     Resp 12/30/21 1149 18     Temp 12/30/21 1149 98.5 F (36.9 C)     Temp Source 12/30/21 1149 Oral     SpO2 12/30/21 1149 95 %     Weight 12/30/21 1148 190 lb (86.2 kg)     Height 12/30/21 1148 '5\' 3"'$  (1.6 m)     Head Circumference --      Peak Flow --      Pain Score 12/30/21 1148 0     Pain Loc --      Pain Edu? --      Excl. in [place]? --     Most recent vital signs: Vitals:   12/30/21 1149  BP: 118/88  Pulse: 93  Resp: 18  Temp: 98.5 F (36.9 C)  SpO2: 95%     General: Awake, no distress.   CV:  Good peripheral perfusion. regular rate and  rhythm Resp:  Normal effort.  Abd:  No distention.   Other:  Raised round hard area noted on the left buttock with obvious draining.   ED Results / Procedures / Treatments   Labs (all labs ordered are listed, but only abnormal results are displayed) Labs Reviewed  BASIC METABOLIC PANEL - Abnormal; Notable for the following components:      Result Value   Sodium 134 (*)    Glucose, Bld 373 (*)    All other components within normal limits  CBC WITH DIFFERENTIAL/PLATELET - Abnormal; Notable for the following components:   Hemoglobin 11.3 (*)    HCT 35.2 (*)    MCV 79.6 (*)    MCH 25.6 (*)    Platelets 456 (*)    All other components within normal limits     EKG     RADIOLOGY     PROCEDURES:   Procedures   MEDICATIONS ORDERED IN ED: Medications  piperacillin-tazobactam (ZOSYN) IVPB 3.375 g  (3.375 g Intravenous New Bag/Given 12/30/21 1621)     IMPRESSION / MDM / [place] / ED COURSE  I reviewed the triage vital signs and the nursing notes.                              Differential diagnosis includes, but is not limited to, abscess, cellulitis, wound infection, pressure ulcer  Patient's presentation is most consistent with acute complicated illness / injury requiring diagnostic workup.   Patient's labs are reassuring, CBC metabolic panel are normal.  Patient was given Zosyn IV.  She will be given Keflex p.o.  I do not feel that she needs to be admitted as her white count is normal and the area appears to be already healing.  However I did give her strict instructions to return if worsening.  Follow-up with her regular doctor as needed.  She is discharged stable condition.      FINAL CLINICAL IMPRESSION(S) / ED DIAGNOSES   Final diagnoses:  Abscess     Rx / DC Orders   ED Discharge Orders          Ordered    cephALEXin (KEFLEX) 500 MG capsule  3 times daily        12/30/21 1707             Note:  This document was prepared using Dragon voice recognition software and may include unintentional dictation errors.    Versie Starks, PA-C 12/30/21 1710    Carrie Mew, MD 12/31/21 [phone]

## 2021-12-30 NOTE — ED Triage Notes (Signed)
Pt via POV from home. Pt states she had an abscess on L buttocks 2.5 weeks ago. States it busted last night and wants it checked. Denies any recent fevers. Denies any pain. Pt is A&Ox4 and NAD

## 2022-02-21 ENCOUNTER — Other Ambulatory Visit: Payer: Self-pay

## 2022-02-21 ENCOUNTER — Emergency Department: Payer: Medicare Other

## 2022-02-21 ENCOUNTER — Emergency Department
Admission: EM | Admit: 2022-02-21 | Discharge: 2022-02-22 | Disposition: A | Discharge status: Home / Self Care | Payer: Medicare Other | Attending: Emergency Medicine | Admitting: Emergency Medicine

## 2022-02-21 ENCOUNTER — Ambulatory Visit: Admit: 2022-02-21 | Payer: Medicare Other | Admitting: Gastroenterology

## 2022-02-21 DIAGNOSIS — Z79899 Other long term (current) drug therapy: Secondary | ICD-10-CM | POA: Diagnosis not present

## 2022-02-21 DIAGNOSIS — R4781 Slurred speech: Secondary | ICD-10-CM | POA: Diagnosis not present

## 2022-02-21 DIAGNOSIS — G43109 Migraine with aura, not intractable, without status migrainosus: Secondary | ICD-10-CM

## 2022-02-21 DIAGNOSIS — E1165 Type 2 diabetes mellitus with hyperglycemia: Secondary | ICD-10-CM | POA: Insufficient documentation

## 2022-02-21 DIAGNOSIS — H538 Other visual disturbances: Secondary | ICD-10-CM | POA: Diagnosis present

## 2022-02-21 DIAGNOSIS — Z859 Personal history of malignant neoplasm, unspecified: Secondary | ICD-10-CM | POA: Insufficient documentation

## 2022-02-21 DIAGNOSIS — H539 Unspecified visual disturbance: Secondary | ICD-10-CM

## 2022-02-21 DIAGNOSIS — R531 Weakness: Secondary | ICD-10-CM | POA: Diagnosis not present

## 2022-02-21 DIAGNOSIS — G43909 Migraine, unspecified, not intractable, without status migrainosus: Secondary | ICD-10-CM | POA: Diagnosis not present

## 2022-02-21 DIAGNOSIS — Z20822 Contact with and (suspected) exposure to covid-19: Secondary | ICD-10-CM | POA: Insufficient documentation

## 2022-02-21 DIAGNOSIS — R630 Anorexia: Secondary | ICD-10-CM | POA: Insufficient documentation

## 2022-02-21 LAB — TROPONIN I (HIGH SENSITIVITY)
Troponin I (High Sensitivity): 5 ng/L (ref <18)
Troponin I (High Sensitivity): 5 ng/L (ref <18)

## 2022-02-21 LAB — COMPREHENSIVE METABOLIC PANEL
ALT: 11 U/L (ref 0–44)
AST: 14 U/L — ABNORMAL LOW (ref 15–41)
Albumin: 3.5 g/dL (ref 3.5–5.0)
Alkaline Phosphatase: 107 U/L (ref 38–126)
Anion gap: 8 (ref 5–15)
BUN: 21 mg/dL — ABNORMAL HIGH (ref 6–20)
CO2: 24 mmol/L (ref 22–32)
Calcium: 9.2 mg/dL (ref 8.9–10.3)
Chloride: 101 mmol/L (ref 98–111)
Creatinine, Ser: 0.92 mg/dL (ref 0.44–1.00)
GFR, Estimated: >60 mL/min (ref >60)
Glucose, Bld: 364 mg/dL — ABNORMAL HIGH (ref 70–99)
Potassium: 4 mmol/L (ref 3.5–5.1)
Sodium: 133 mmol/L — ABNORMAL LOW (ref 135–145)
Total Bilirubin: 0.4 mg/dL (ref 0.3–1.2)
Total Protein: 8.4 g/dL — ABNORMAL HIGH (ref 6.5–8.1)

## 2022-02-21 LAB — URINE DRUG SCREEN, QUALITATIVE (ARMC ONLY)
Amphetamines, Ur Screen: NOT DETECTED
Barbiturates, Ur Screen: NOT DETECTED
Benzodiazepine, Ur Scrn: NOT DETECTED
Cannabinoid 50 Ng, Ur ~~LOC~~: NOT DETECTED
Cocaine Metabolite,Ur ~~LOC~~: NOT DETECTED
MDMA (Ecstasy)Ur Screen: NOT DETECTED
Methadone Scn, Ur: NOT DETECTED
Opiate, Ur Screen: NOT DETECTED
Phencyclidine (PCP) Ur S: NOT DETECTED
Tricyclic, Ur Screen: NOT DETECTED

## 2022-02-21 LAB — URINALYSIS, ROUTINE W REFLEX MICROSCOPIC
Bacteria, UA: NONE SEEN
Bilirubin Urine: NEGATIVE
Glucose, UA: >=500 mg/dL — AB
Hgb urine dipstick: NEGATIVE
Ketones, ur: NEGATIVE mg/dL
Leukocytes,Ua: NEGATIVE
Nitrite: NEGATIVE
Protein, ur: >=300 mg/dL — AB
Specific Gravity, Urine: 1.025 (ref 1.005–1.030)
pH: 5 (ref 5.0–8.0)

## 2022-02-21 LAB — CBC WITH DIFFERENTIAL/PLATELET
Abs Immature Granulocytes: 0.02 10*3/uL (ref 0.00–0.07)
Basophils Absolute: 0 10*3/uL (ref 0.0–0.1)
Basophils Relative: 0 %
Eosinophils Absolute: 0.3 10*3/uL (ref 0.0–0.5)
Eosinophils Relative: 3 %
HCT: 37.3 % (ref 36.0–46.0)
Hemoglobin: 12.6 g/dL (ref 12.0–15.0)
Immature Granulocytes: 0 %
Lymphocytes Relative: 38 %
Lymphs Abs: 3.6 10*3/uL (ref 0.7–4.0)
MCH: 26.4 pg (ref 26.0–34.0)
MCHC: 33.8 g/dL (ref 30.0–36.0)
MCV: 78 fL — ABNORMAL LOW (ref 80.0–100.0)
Monocytes Absolute: 0.6 10*3/uL (ref 0.1–1.0)
Monocytes Relative: 7 %
Neutro Abs: 4.8 10*3/uL (ref 1.7–7.7)
Neutrophils Relative %: 52 %
Platelets: 388 10*3/uL (ref 150–400)
RBC: 4.78 MIL/uL (ref 3.87–5.11)
RDW: 13.6 % (ref 11.5–15.5)
WBC: 9.3 10*3/uL (ref 4.0–10.5)
nRBC: 0 % (ref 0.0–0.2)

## 2022-02-21 LAB — CBG MONITORING, ED
Glucose-Capillary: 387 mg/dL — ABNORMAL HIGH (ref 70–99)
Glucose-Capillary: 225 mg/dL — ABNORMAL HIGH (ref 70–99)

## 2022-02-21 LAB — LACTIC ACID, PLASMA
Lactic Acid, Venous: 1.4 mmol/L (ref 0.5–1.9)
Lactic Acid, Venous: 2.2 mmol/L (ref 0.5–1.9)
Lactic Acid, Venous: 1.8 mmol/L (ref 0.5–1.9)

## 2022-02-21 LAB — SARS CORONAVIRUS 2 BY RT PCR: SARS Coronavirus 2 by RT PCR: NEGATIVE

## 2022-02-21 LAB — BLOOD GAS, ARTERIAL
Acid-Base Excess: 2.9 mmol/L — ABNORMAL HIGH (ref 0.0–2.0)
Bicarbonate: 27.9 mmol/L (ref 20.0–28.0)
O2 Saturation: 97.3 %
Patient temperature: 37
pCO2 arterial: 43 mmHg (ref 32–48)
pH, Arterial: 7.42 (ref 7.35–7.45)
pO2, Arterial: 78 mmHg — ABNORMAL LOW (ref 83–108)

## 2022-02-21 LAB — CK: Total CK: 83 U/L (ref 38–234)

## 2022-02-21 SURGERY — COLONOSCOPY
Anesthesia: General

## 2022-02-21 MED ORDER — SODIUM CHLORIDE 0.9 % IV BOLUS: 1000.0000 mL | Freq: Once | INTRAVENOUS | Status: DC

## 2022-02-21 MED ORDER — AMMONIA AROMATIC IN INHA
1.0000 | Freq: Once | RESPIRATORY_TRACT | Status: AC
  Administered 2022-02-21: 1 via RESPIRATORY_TRACT
  Filled 2022-02-21: qty 10

## 2022-02-21 MED ORDER — LORAZEPAM 2 MG/ML IJ SOLN
1.0000 mg | Freq: Once | INTRAMUSCULAR | Status: AC | PRN
  Administered 2022-02-21: 1 mg via INTRAVENOUS
  Filled 2022-02-21: qty 1

## 2022-02-21 MED ORDER — DIPHENHYDRAMINE HCL 50 MG/ML IJ SOLN
25.0000 mg | Freq: Once | INTRAMUSCULAR | Status: AC
  Administered 2022-02-21: 25 mg via INTRAVENOUS
  Filled 2022-02-21: qty 1

## 2022-02-21 MED ORDER — SODIUM CHLORIDE 0.9 % IV BOLUS
1000.0000 mL | Freq: Once | INTRAVENOUS | Status: AC
  Administered 2022-02-21: 1000 mL via INTRAVENOUS

## 2022-02-21 MED ORDER — PROCHLORPERAZINE EDISYLATE 10 MG/2ML IJ SOLN
10.0000 mg | Freq: Once | INTRAMUSCULAR | Status: AC
  Administered 2022-02-21: 10 mg via INTRAVENOUS
  Filled 2022-02-21: qty 2

## 2022-02-21 NOTE — ED Triage Notes (Signed)
See First RN noted.  Pt c/o seeing "blue marbling" in bilateral eyes and generalized malaise starting this morning and [place]feeling [place]" [place]  Pt thinks she got "overheated."    Pt c/o insomnia x  3 days.

## 2022-02-21 NOTE — ED Triage Notes (Signed)
First Nurse Note;  Pt via EMS from home. Pt c/o "seeing blue" and just generalized malaise that has been going on since Saturday but got worse today. States sht think it may be a panic attack. Pt is A&Ox4 and NAD  150/90 BP  100 HR 96% on RA 98.3  390 CBG

## 2022-02-21 NOTE — ED Provider Notes (Signed)
[facility] Provider Note    Event Date/Time   First MD Initiated Contact with Patient 02/21/22 1702     (approximate)   History   Weakness   HPI  Wendy Frazier is a 51 y.o. female here with generalized weakness.  The patient states that earlier today, she developed acute, generalized weakness and felt like something was "coming over her."  She states that she began seeing blue spots in her bilateral visual fields.  She felt dizzy.  She also felt off balance.  The symptoms of persisted throughout most the day today.  She has a history of complex migraines with visual auras, but has never had the blue spots before.  Denies recent medication changes.  She states she has had a slightly decreased appetite recently but no overt abdominal pain, nausea, or vomiting.  She states she has had a few areas of red spots on her skin around hair follicles, and was concerned that she may have a blood infection.  No fevers.  No systemic signs of infection.  No history of MRSA.  No other complaints.     Physical Exam   Triage Vital Signs: ED Triage Vitals  Enc Vitals Group     BP 02/21/22 1423 (!) 141/97     Pulse Rate 02/21/22 1423 (!) 104     Resp 02/21/22 1423 20     Temp 02/21/22 1423 98.3 F (36.8 C)     Temp Source 02/21/22 1423 Oral     SpO2 02/21/22 1423 97 %     Weight 02/21/22 1424 190 lb (86.2 kg)     Height 02/21/22 1424 '5\' 3"'$  (1.6 m)     Head Circumference --      Peak Flow --      Pain Score 02/21/22 1424 0     Pain Loc --      Pain Edu? --      Excl. in [place]? --     Most recent vital signs: Vitals:   02/21/22 2300 02/21/22 2315  BP: 123/87   Pulse: 95   Resp: 15   Temp:  98.6 F (37 C)  SpO2: 94%      General: Awake, no distress.  CV:  Good peripheral perfusion.  Regular rate and rhythm. Resp:  Normal effort.  Lungs clear bilaterally Abd:  No distention.  No tenderness. Other:  Cranial nerves II through XII intact although possible subtle  right nasolabial flattening is noted.  Speech is slightly slurred but that she has a speech impediment which she states is at her baseline.  Strength 5 out of 5 bilateral extremities.  Normal sensation light touch.  No dysmetria on finger-to-nose.     ED Results / Procedures / Treatments   Labs (all labs ordered are listed, but only abnormal results are displayed) Labs Reviewed  COMPREHENSIVE METABOLIC PANEL - Abnormal; Notable for the following components:      Result Value   Sodium 133 (*)    Glucose, Bld 364 (*)    BUN 21 (*)    Total Protein 8.4 (*)    AST 14 (*)    All other components within normal limits  LACTIC ACID, PLASMA - Abnormal; Notable for the following components:   Lactic Acid, Venous 2.2 (*)    All other components within normal limits  CBC WITH DIFFERENTIAL/PLATELET - Abnormal; Notable for the following components:   MCV 78.0 (*)    All other components within normal limits  URINALYSIS, ROUTINE W REFLEX MICROSCOPIC - Abnormal; Notable for the following components:   Color, Urine YELLOW (*)    APPearance CLEAR (*)    Glucose, UA >=500 (*)    Protein, ur >=300 (*)    All other components within normal limits  BLOOD GAS, ARTERIAL - Abnormal; Notable for the following components:   pO2, Arterial 78 (*)    Acid-Base Excess 2.9 (*)    All other components within normal limits  CBG MONITORING, ED - Abnormal; Notable for the following components:   Glucose-Capillary 387 (*)    All other components within normal limits  CBG MONITORING, ED - Abnormal; Notable for the following components:   Glucose-Capillary 225 (*)    All other components within normal limits  SARS CORONAVIRUS 2 BY RT PCR  LACTIC ACID, PLASMA  CK  URINE DRUG SCREEN, QUALITATIVE ([facility] ONLY)  LACTIC ACID, PLASMA  TROPONIN I (HIGH SENSITIVITY)  TROPONIN I (HIGH SENSITIVITY)     EKG Sinus tachycardia, trickle rate 102.  PR 152, QRS 92, QTc 44.  No acute ST elevations or depressions.  No EKG  evidence of acute ischemia or infarct.   RADIOLOGY CT head: No acute abnormality Chest x-ray: Clear   I also independently reviewed and agree with radiologist interpretations.   PROCEDURES:  Critical Care performed: No    MEDICATIONS ORDERED IN ED: Medications  sodium chloride 0.9 % bolus 1,000 mL (0 mLs Intravenous Stopped 02/21/22 2249)  prochlorperazine (COMPAZINE) injection 10 mg (10 mg Intravenous Given 02/21/22 1743)  diphenhydrAMINE (BENADRYL) injection 25 mg (25 mg Intravenous Given 02/21/22 1744)  LORazepam (ATIVAN) injection 1 mg (1 mg Intravenous Given 02/21/22 1955)  sodium chloride 0.9 % bolus 1,000 mL (1,000 mLs Intravenous New Bag/Given 02/21/22 2301)  ammonia inhalant 1 each (1 each Inhalation Given 02/21/22 2323)     IMPRESSION / MDM / ASSESSMENT AND PLAN / ED COURSE  I reviewed the triage vital signs and the nursing notes.                               The patient is on the cardiac monitor to evaluate for evidence of arrhythmia and/or significant heart rate changes.   Ddx:  Differential includes the following, with pertinent life- or limb-threatening emergencies considered:  Complicated migraine, CVA, TIA, atypical presentation of her CIDP, generalized weakness secondary to UTI, pneumonia, dehydration, metabolic encephalopathy  Patient's presentation is most consistent with acute presentation with potential threat to life or bodily function.  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  Primary complaint is generalized weakness, but also reports that she has been seeing "blue" spots for the last 24 hours.  On neuro exam, she has slurred speech and slight speech impediment but she states this is her baseline.  I see no new focal neurological deficits.  She had chronic lower extremity weakness on the left.  She endorses seeing blue spots in her vision, but  this is bilateral.  No correction with covering up 1 eye.  CT scan obtained, shows no acute abnormality.  Patient subsequently sent for MRI as well which shows no evidence of stroke.  Her lab work is very reassuring.  CBC is unremarkable.  Lactic acid normal.  Troponin negative.  CK normal.  Lactic acid was 2.2 but this is likely error versus related to being in the ED without much to  eat or drink and repeat was normal.  UA negative for UTI.  Blood gas without signs of retention.  Patient drowsy after receiving Ativan for her MRI for her claustrophobia.  She does state that the blue spots have resolved with migraine cocktail given earlier.  Clinically I suspect complicated migraine.  Given her drowsiness, will need to be monitored for return to her baseline.  If she returns to baseline with no persistent visual or other focal neurological symptoms, feel she can be discharged home with outpatient neurology follow-up.   MEDICATIONS GIVEN IN ED: Medications  sodium chloride 0.9 % bolus 1,000 mL (0 mLs Intravenous Stopped 02/21/22 2249)  prochlorperazine (COMPAZINE) injection 10 mg (10 mg Intravenous Given 02/21/22 1743)  diphenhydrAMINE (BENADRYL) injection 25 mg (25 mg Intravenous Given 02/21/22 1744)  LORazepam (ATIVAN) injection 1 mg (1 mg Intravenous Given 02/21/22 1955)  sodium chloride 0.9 % bolus 1,000 mL (1,000 mLs Intravenous New Bag/Given 02/21/22 2301)  ammonia inhalant 1 each (1 each Inhalation Given 02/21/22 2323)     Consults:     EMR reviewed  Prior ED visits, [facility] visits for CIDP/PT     FINAL CLINICAL IMPRESSION(S) / ED DIAGNOSES   Final diagnoses:  Complicated migraine  Visual changes     Rx / DC Orders   ED Discharge Orders     None        Note:  This document was prepared using Dragon voice recognition software and may include unintentional dictation errors.   Duffy Bruce, MD 02/21/22 [phone]

## 2022-02-21 NOTE — ED Notes (Signed)
Patient resting in bed free from sign of distress. Breathing unlabored  with symmetric chest rise and fall.  Pt difficult to wake but does so with loud and repeated verbal stimulation. MD just administered ammonia packet to patient. Pt in bed with bed low and locked with side rails raised x2. Call bell in reach and monitor in place.

## 2022-02-21 NOTE — ED Notes (Signed)
Patient resting in bed free from sign of distress. Breathing unlabored speaking in full sentences with symmetric chest rise and fall. Bed low and locked with side rails raised x2. Call bell in reach and monitor in place.   

## 2022-02-21 NOTE — ED Provider Triage Note (Signed)
Emergency Medicine Provider Triage Evaluation Note  Wendy Frazier , a 51 y.o. female  was evaluated in triage.  Pt complains of malaise, weakness and seeing blue marble on the walls of both sides.  Patient states she was out in the heat on Saturday and the weakness started after that.  Blue marbling in her vision started around 8 AM..  Review of Systems  Positive: See above Negative:   Physical Exam  LMP 11/10/2017 (Approximate)  Gen:   Awake, no distress   Resp:  Normal effort  MSK:   Moves extremities without difficulty  Other:    Medical Decision Making  Medically screening exam initiated at 2:23 PM.  Appropriate orders placed.  Wendy Frazier was informed that the remainder of the evaluation will be completed by another provider, this initial triage assessment does not replace that evaluation, and the importance of remaining in the ED until their evaluation is complete.     Versie Starks, PA-C 02/21/22 1424

## 2022-02-22 DIAGNOSIS — G43909 Migraine, unspecified, not intractable, without status migrainosus: Secondary | ICD-10-CM | POA: Diagnosis not present

## 2022-02-22 NOTE — ED Notes (Signed)
Patient discharged at this time. Wheeled to lobby and assisted into vehicle per pt baseline. Breathing unlabored speaking in full sentences. Verbalized understanding of all discharge, follow up, and medication teaching. Discharged homed with all belongings.

## 2022-02-22 NOTE — ED Provider Notes (Signed)
Patient received in signout from Dr. Ellender Hose pending reevaluation in the setting of lethargy after receiving ativan for an MRI.  Patient has had a reassuring work-up in the setting of vision changes and weakness, including a normal MRI of her brain, reassuring serum tests.  I reassessed patient and she awakens easily and stays awake, indicating that she feels better now and her vision is improved.  She asks if she can go home after she sees the time and notes that it is late.  She calls her sister, who is now on the way to pick up patient.  We discussed return precautions.   Vladimir Crofts, MD 02/22/22 [phone]

## 2022-08-01 ENCOUNTER — Other Ambulatory Visit (HOSPITAL_BASED_OUTPATIENT_CLINIC_OR_DEPARTMENT_OTHER): Payer: Self-pay

## 2022-08-17 ENCOUNTER — Other Ambulatory Visit: Payer: Self-pay | Admitting: Nurse Practitioner

## 2022-08-17 DIAGNOSIS — Z1231 Encounter for screening mammogram for malignant neoplasm of breast: Secondary | ICD-10-CM

## 2023-07-11 ENCOUNTER — Emergency Department
Admission: EM | Admit: 2023-07-11 | Discharge: 2023-07-11 | Disposition: A | Discharge status: Home / Self Care | Payer: 59 | Attending: Emergency Medicine | Admitting: Emergency Medicine

## 2023-07-11 ENCOUNTER — Emergency Department: Payer: 59

## 2023-07-11 DIAGNOSIS — Z8543 Personal history of malignant neoplasm of ovary: Secondary | ICD-10-CM | POA: Diagnosis not present

## 2023-07-11 DIAGNOSIS — E1165 Type 2 diabetes mellitus with hyperglycemia: Secondary | ICD-10-CM | POA: Insufficient documentation

## 2023-07-11 DIAGNOSIS — R0602 Shortness of breath: Secondary | ICD-10-CM | POA: Diagnosis present

## 2023-07-11 DIAGNOSIS — R791 Abnormal coagulation profile: Secondary | ICD-10-CM | POA: Diagnosis not present

## 2023-07-11 DIAGNOSIS — J181 Lobar pneumonia, unspecified organism: Secondary | ICD-10-CM | POA: Insufficient documentation

## 2023-07-11 DIAGNOSIS — Z20822 Contact with and (suspected) exposure to covid-19: Secondary | ICD-10-CM | POA: Insufficient documentation

## 2023-07-11 DIAGNOSIS — J45909 Unspecified asthma, uncomplicated: Secondary | ICD-10-CM | POA: Diagnosis not present

## 2023-07-11 DIAGNOSIS — R739 Hyperglycemia, unspecified: Secondary | ICD-10-CM

## 2023-07-11 DIAGNOSIS — J189 Pneumonia, unspecified organism: Secondary | ICD-10-CM

## 2023-07-11 LAB — BRAIN NATRIURETIC PEPTIDE: B Natriuretic Peptide: 22.2 pg/mL (ref 0.0–100.0)

## 2023-07-11 LAB — COMPREHENSIVE METABOLIC PANEL
ALT: 16 U/L (ref 0–44)
AST: 24 U/L (ref 15–41)
Albumin: 3 g/dL — ABNORMAL LOW (ref 3.5–5.0)
Alkaline Phosphatase: 124 U/L (ref 38–126)
Anion gap: 14 (ref 5–15)
BUN: 17 mg/dL (ref 6–20)
CO2: 22 mmol/L (ref 22–32)
Calcium: 8.7 mg/dL — ABNORMAL LOW (ref 8.9–10.3)
Chloride: 90 mmol/L — ABNORMAL LOW (ref 98–111)
Creatinine, Ser: 1.29 mg/dL — ABNORMAL HIGH (ref 0.44–1.00)
GFR, Estimated: 50 mL/min — ABNORMAL LOW (ref >60)
Glucose, Bld: 738 mg/dL (ref 70–99)
Potassium: 4.1 mmol/L (ref 3.5–5.1)
Sodium: 126 mmol/L — ABNORMAL LOW (ref 135–145)
Total Bilirubin: 0.4 mg/dL (ref <1.2)
Total Protein: 7.2 g/dL (ref 6.5–8.1)

## 2023-07-11 LAB — URINALYSIS, ROUTINE W REFLEX MICROSCOPIC
Bilirubin Urine: NEGATIVE
Glucose, UA: >=500 mg/dL — AB
Ketones, ur: NEGATIVE mg/dL
Leukocytes,Ua: NEGATIVE
Nitrite: NEGATIVE
Protein, ur: 100 mg/dL — AB
Specific Gravity, Urine: 1.03 (ref 1.005–1.030)
pH: 5 (ref 5.0–8.0)

## 2023-07-11 LAB — CBC
HCT: 37.3 % (ref 36.0–46.0)
Hemoglobin: 12.8 g/dL (ref 12.0–15.0)
MCH: 26.9 pg (ref 26.0–34.0)
MCHC: 34.3 g/dL (ref 30.0–36.0)
MCV: 78.4 fL — ABNORMAL LOW (ref 80.0–100.0)
Platelets: 338 10*3/uL (ref 150–400)
RBC: 4.76 MIL/uL (ref 3.87–5.11)
RDW: 12.6 % (ref 11.5–15.5)
WBC: 7.3 10*3/uL (ref 4.0–10.5)
nRBC: 0 % (ref 0.0–0.2)

## 2023-07-11 LAB — RESP PANEL BY RT-PCR (RSV, FLU A&B, COVID)  RVPGX2
Influenza A by PCR: NEGATIVE
Influenza B by PCR: NEGATIVE
Resp Syncytial Virus by PCR: NEGATIVE
SARS Coronavirus 2 by RT PCR: NEGATIVE

## 2023-07-11 LAB — D-DIMER, QUANTITATIVE: D-Dimer, Quant: 0.59 ug{FEU}/mL — ABNORMAL HIGH (ref 0.00–0.50)

## 2023-07-11 LAB — TROPONIN I (HIGH SENSITIVITY): Troponin I (High Sensitivity): 6 ng/L (ref <18)

## 2023-07-11 LAB — CBG MONITORING, ED
Glucose-Capillary: 458 mg/dL — ABNORMAL HIGH (ref 70–99)
Glucose-Capillary: 361 mg/dL — ABNORMAL HIGH (ref 70–99)

## 2023-07-11 MED ORDER — IOHEXOL 350 MG/ML SOLN
80.0000 mL | Freq: Once | INTRAVENOUS | Status: AC | PRN
  Administered 2023-07-11: 80 mL via INTRAVENOUS

## 2023-07-11 MED ORDER — SODIUM CHLORIDE 0.9 % IV BOLUS
1000.0000 mL | Freq: Once | INTRAVENOUS | Status: AC
  Administered 2023-07-11: 1000 mL via INTRAVENOUS

## 2023-07-11 MED ORDER — INSULIN ASPART 100 UNIT/ML IJ SOLN
8.0000 [IU] | Freq: Once | INTRAMUSCULAR | Status: AC
  Administered 2023-07-11: 8 [IU] via SUBCUTANEOUS
  Filled 2023-07-11: qty 1

## 2023-07-11 MED ORDER — CEFDINIR 300 MG PO CAPS: 300.0000 mg | ORAL_CAPSULE | Freq: Two times a day (BID) | ORAL | 0 refills | Status: AC

## 2023-07-11 MED ORDER — AZITHROMYCIN 250 MG PO TABS: ORAL_TABLET | ORAL | 0 refills | Status: AC

## 2023-07-11 NOTE — Inpatient Diabetes Management (Signed)
Inpatient Diabetes Program Recommendations  AACE/ADA: New Consensus Statement on Inpatient Glycemic Control (2015)  Target Ranges:  Prepandial:   less than 140 mg/dL      Peak postprandial:   less than 180 mg/dL (1-2 hours)      Critically ill patients:  140 - 180 mg/dL   Lab Results  Component Value Date   GLUCAP 225 (H) 02/21/2022   HGBA1C 11.8 (H) 05/20/2018    Review of Glycemic Control  Latest Reference Range & Units 07/11/23 11:33  Glucose 70 - 99 mg/dL 295 (HH)   Diabetes history: DM 2 Outpatient Diabetes medications:  Novolog 5 units tid with meals-unsure if patient is taking Metformin 1000 mg daily Lantus 23 units q HS- unsure if patient is taking Current orders for Inpatient glycemic control:  Novolog 8 units x1 at 12:45 p  Inpatient Diabetes Program Recommendations:    Note blood sugar>700 mg/dL.  Unclear if patient was taking insulin at home prior to admit.  Need to recheck blood sugar to see if improving.   Thanks,  Lorenza Cambridge, RN, BC-ADM Inpatient Diabetes Coordinator Pager [phone]  (8a-5p)

## 2023-07-11 NOTE — ED Triage Notes (Signed)
Pt here with a cough and SOB for a while. Pt states her SOB is worse when lying down. Pt endorses diarrhea x2 days. Pt denies cp but states she cannot clear her lungs.

## 2023-07-11 NOTE — Discharge Instructions (Signed)
Your blood sugar was very high and you were given insulin and fluids to help bring this down.  It is very important that you pay closer attention to your sugar at home and take your insulin as prescribed.  In addition, you were found to have pneumonia.  COVID is very importantly take the antibiotics as prescribed as well.  Please return for any new, worsening, or change in symptoms or other concerns.  It was a pleasure caring for you today.
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
## 2023-07-11 NOTE — ED Provider Notes (Signed)
Shared visit  Past medical history dependent for diabetes on insulin and has been noncompliant with insulin recently, presents to the emergency department with a cough.  Also complaining of chest pain.  Glucose significantly elevated in the 700s but does not meet criteria for DKA.  No altered mental status and have low suspicion for HHS.  Chest x-ray on my evaluation without focal findings consistent with pneumonia.  Low risk Wells criteria D-dimer is positive so we will order a CTA.  IV fluids and insulin ordered will recheck glucose.  .Critical Care  Performed by: Corena Herter, MD Authorized by: Corena Herter, MD   Critical care provider statement:    Critical care time (minutes):  15   Critical care time was exclusive of:  Separately billable procedures and treating other patients   Critical care was necessary to treat or prevent imminent or life-threatening deterioration of the following conditions:  Endocrine crisis   Critical care was time spent personally by me on the following activities:  Development of treatment plan with patient or surrogate, discussions with consultants, evaluation of patient's response to treatment, examination of patient, ordering and review of laboratory studies, ordering and review of radiographic studies, ordering and performing treatments and interventions, pulse oximetry, re-evaluation of patient's condition and review of old charts     Corena Herter, MD 07/11/23 1328

## 2024-03-27 ENCOUNTER — Other Ambulatory Visit: Payer: Self-pay | Admitting: Family Medicine

## 2024-03-27 ENCOUNTER — Ambulatory Visit
Admission: RE | Admit: 2024-03-27 | Discharge: 2024-03-27 | Disposition: A | Discharge status: Home / Self Care | Source: Ambulatory Visit | Attending: Family Medicine | Admitting: Family Medicine

## 2024-03-27 ENCOUNTER — Encounter: Payer: Self-pay | Admitting: Family Medicine

## 2024-03-27 DIAGNOSIS — R0600 Dyspnea, unspecified: Secondary | ICD-10-CM

## 2024-06-03 ENCOUNTER — Encounter: Attending: Physician Assistant | Admitting: Physician Assistant

## 2024-06-03 DIAGNOSIS — G4733 Obstructive sleep apnea (adult) (pediatric): Secondary | ICD-10-CM | POA: Insufficient documentation

## 2024-06-03 DIAGNOSIS — F322 Major depressive disorder, single episode, severe without psychotic features: Secondary | ICD-10-CM | POA: Insufficient documentation

## 2024-06-03 DIAGNOSIS — L97512 Non-pressure chronic ulcer of other part of right foot with fat layer exposed: Secondary | ICD-10-CM | POA: Insufficient documentation

## 2024-06-03 DIAGNOSIS — E11621 Type 2 diabetes mellitus with foot ulcer: Secondary | ICD-10-CM | POA: Insufficient documentation

## 2024-06-03 DIAGNOSIS — E1143 Type 2 diabetes mellitus with diabetic autonomic (poly)neuropathy: Secondary | ICD-10-CM | POA: Insufficient documentation

## 2024-06-03 DIAGNOSIS — L981 Factitial dermatitis: Secondary | ICD-10-CM | POA: Insufficient documentation

## 2024-06-03 DIAGNOSIS — L97822 Non-pressure chronic ulcer of other part of left lower leg with fat layer exposed: Secondary | ICD-10-CM | POA: Insufficient documentation

## 2024-06-03 DIAGNOSIS — L97522 Non-pressure chronic ulcer of other part of left foot with fat layer exposed: Secondary | ICD-10-CM | POA: Insufficient documentation

## 2024-06-11 ENCOUNTER — Encounter: Admitting: Physician Assistant

## 2024-06-25 ENCOUNTER — Encounter: Attending: Physician Assistant | Admitting: Physician Assistant

## 2024-06-25 DIAGNOSIS — L97512 Non-pressure chronic ulcer of other part of right foot with fat layer exposed: Secondary | ICD-10-CM | POA: Diagnosis not present

## 2024-06-25 DIAGNOSIS — L981 Factitial dermatitis: Secondary | ICD-10-CM | POA: Insufficient documentation

## 2024-06-25 DIAGNOSIS — E11621 Type 2 diabetes mellitus with foot ulcer: Secondary | ICD-10-CM | POA: Insufficient documentation

## 2024-06-25 DIAGNOSIS — F322 Major depressive disorder, single episode, severe without psychotic features: Secondary | ICD-10-CM | POA: Insufficient documentation

## 2024-06-25 DIAGNOSIS — E1143 Type 2 diabetes mellitus with diabetic autonomic (poly)neuropathy: Secondary | ICD-10-CM | POA: Insufficient documentation

## 2024-06-25 DIAGNOSIS — G4733 Obstructive sleep apnea (adult) (pediatric): Secondary | ICD-10-CM | POA: Diagnosis not present

## 2024-06-25 DIAGNOSIS — L97522 Non-pressure chronic ulcer of other part of left foot with fat layer exposed: Secondary | ICD-10-CM | POA: Diagnosis not present

## 2024-06-25 DIAGNOSIS — L97822 Non-pressure chronic ulcer of other part of left lower leg with fat layer exposed: Secondary | ICD-10-CM | POA: Insufficient documentation

## 2024-07-02 ENCOUNTER — Emergency Department

## 2024-07-02 ENCOUNTER — Other Ambulatory Visit: Payer: Self-pay

## 2024-07-02 ENCOUNTER — Emergency Department
Admission: EM | Admit: 2024-07-02 | Discharge: 2024-07-02 | Disposition: A | Discharge status: Home / Self Care | Attending: Emergency Medicine | Admitting: Emergency Medicine

## 2024-07-02 ENCOUNTER — Encounter: Payer: Self-pay | Admitting: Intensive Care

## 2024-07-02 DIAGNOSIS — R778 Other specified abnormalities of plasma proteins: Secondary | ICD-10-CM | POA: Diagnosis not present

## 2024-07-02 DIAGNOSIS — S299XXA Unspecified injury of thorax, initial encounter: Secondary | ICD-10-CM | POA: Diagnosis present

## 2024-07-02 DIAGNOSIS — M25561 Pain in right knee: Secondary | ICD-10-CM | POA: Insufficient documentation

## 2024-07-02 DIAGNOSIS — J45909 Unspecified asthma, uncomplicated: Secondary | ICD-10-CM | POA: Diagnosis not present

## 2024-07-02 DIAGNOSIS — R600 Localized edema: Secondary | ICD-10-CM | POA: Insufficient documentation

## 2024-07-02 DIAGNOSIS — M25562 Pain in left knee: Secondary | ICD-10-CM | POA: Insufficient documentation

## 2024-07-02 DIAGNOSIS — E119 Type 2 diabetes mellitus without complications: Secondary | ICD-10-CM | POA: Insufficient documentation

## 2024-07-02 DIAGNOSIS — M25512 Pain in left shoulder: Secondary | ICD-10-CM | POA: Insufficient documentation

## 2024-07-02 LAB — CBC
HCT: 38.2 % (ref 36.0–46.0)
Hemoglobin: 13 g/dL (ref 12.0–15.0)
MCH: 27.1 pg (ref 26.0–34.0)
MCHC: 34 g/dL (ref 30.0–36.0)
MCV: 79.6 fL — ABNORMAL LOW (ref 80.0–100.0)
Platelets: 377 K/uL (ref 150–400)
RBC: 4.8 MIL/uL (ref 3.87–5.11)
RDW: 12.7 % (ref 11.5–15.5)
WBC: 13.8 K/uL — ABNORMAL HIGH (ref 4.0–10.5)
nRBC: 0 % (ref 0.0–0.2)

## 2024-07-02 LAB — BASIC METABOLIC PANEL WITH GFR
Anion gap: 14 (ref 5–15)
BUN: 23 mg/dL — ABNORMAL HIGH (ref 6–20)
CO2: 25 mmol/L (ref 22–32)
Calcium: 9.6 mg/dL (ref 8.9–10.3)
Chloride: 92 mmol/L — ABNORMAL LOW (ref 98–111)
Creatinine, Ser: 1.15 mg/dL — ABNORMAL HIGH (ref 0.44–1.00)
GFR, Estimated: 57 mL/min — ABNORMAL LOW (ref >60)
Glucose, Bld: 560 mg/dL (ref 70–99)
Potassium: 4.3 mmol/L (ref 3.5–5.1)
Sodium: 132 mmol/L — ABNORMAL LOW (ref 135–145)

## 2024-07-02 LAB — TROPONIN T, HIGH SENSITIVITY
Troponin T High Sensitivity: 38 ng/L — ABNORMAL HIGH (ref 0–19)
Troponin T High Sensitivity: 36 ng/L — ABNORMAL HIGH (ref 0–19)

## 2024-07-02 MED ORDER — SODIUM CHLORIDE 0.9 % IV BOLUS: 1000.0000 mL | Freq: Once | INTRAVENOUS | Status: DC

## 2024-07-02 MED ORDER — OXYCODONE-ACETAMINOPHEN 5-325 MG PO TABS: 1.0000 | ORAL_TABLET | ORAL | 0 refills | Status: AC | PRN

## 2024-07-02 MED ORDER — INSULIN ASPART 100 UNIT/ML IJ SOLN
10.0000 [IU] | Freq: Once | INTRAMUSCULAR | Status: AC
  Administered 2024-07-02: 10 [IU] via INTRAVENOUS
  Filled 2024-07-02: qty 10

## 2024-07-02 MED ORDER — OXYCODONE-ACETAMINOPHEN 5-325 MG PO TABS
1.0000 | ORAL_TABLET | Freq: Once | ORAL | Status: AC
  Administered 2024-07-02: 1 via ORAL
  Filled 2024-07-02: qty 1

## 2024-07-02 MED ORDER — IOHEXOL 300 MG/ML  SOLN
75.0000 mL | Freq: Once | INTRAMUSCULAR | Status: AC | PRN
  Administered 2024-07-02: 75 mL via INTRAVENOUS
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
## 2024-07-02 NOTE — ED Notes (Signed)
 Assumed care of pt at this time, pt refusing IV fluids. Pt wants insulin  and pain medication and to be discharge. Jossie, MD notified and ok w/ plan of care. Pt's sister at bedside and pt's transportation home.

## 2024-07-03 ENCOUNTER — Other Ambulatory Visit: Payer: Self-pay | Admitting: Nurse Practitioner

## 2024-07-03 DIAGNOSIS — Z1231 Encounter for screening mammogram for malignant neoplasm of breast: Secondary | ICD-10-CM

## 2024-07-09 ENCOUNTER — Encounter: Admitting: Internal Medicine

## 2024-08-06 ENCOUNTER — Encounter: Admitting: Physician Assistant

## 2024-08-13 ENCOUNTER — Encounter: Attending: Physician Assistant | Admitting: Physician Assistant

## 2024-08-13 DIAGNOSIS — L97822 Non-pressure chronic ulcer of other part of left lower leg with fat layer exposed: Secondary | ICD-10-CM | POA: Insufficient documentation

## 2024-08-13 DIAGNOSIS — L97512 Non-pressure chronic ulcer of other part of right foot with fat layer exposed: Secondary | ICD-10-CM | POA: Insufficient documentation

## 2024-08-13 DIAGNOSIS — L981 Factitial dermatitis: Secondary | ICD-10-CM | POA: Diagnosis not present

## 2024-08-13 DIAGNOSIS — L97522 Non-pressure chronic ulcer of other part of left foot with fat layer exposed: Secondary | ICD-10-CM | POA: Diagnosis not present

## 2024-08-13 DIAGNOSIS — E1143 Type 2 diabetes mellitus with diabetic autonomic (poly)neuropathy: Secondary | ICD-10-CM | POA: Insufficient documentation

## 2024-08-13 DIAGNOSIS — G4733 Obstructive sleep apnea (adult) (pediatric): Secondary | ICD-10-CM | POA: Diagnosis not present

## 2024-08-13 DIAGNOSIS — F322 Major depressive disorder, single episode, severe without psychotic features: Secondary | ICD-10-CM | POA: Insufficient documentation

## 2024-08-13 DIAGNOSIS — E11621 Type 2 diabetes mellitus with foot ulcer: Secondary | ICD-10-CM | POA: Insufficient documentation

## 2024-08-27 ENCOUNTER — Encounter: Admitting: Physician Assistant

## 2024-09-04 ENCOUNTER — Encounter: Admitting: Internal Medicine
